# Patient Record
Sex: Female | Born: 1975
Health system: Southern US, Community
[De-identification: ages and names within clinical notes are randomized; demographics above are authoritative.]

## PROBLEM LIST (undated history)

## (undated) DIAGNOSIS — F411 Generalized anxiety disorder: Secondary | ICD-10-CM

## (undated) DIAGNOSIS — Z87891 Personal history of nicotine dependence: Secondary | ICD-10-CM

## (undated) DIAGNOSIS — F341 Dysthymic disorder: Secondary | ICD-10-CM

## (undated) DIAGNOSIS — R87619 Unspecified abnormal cytological findings in specimens from cervix uteri: Secondary | ICD-10-CM

## (undated) HISTORY — DX: Personal history of nicotine dependence: Z87.891

## (undated) HISTORY — DX: Unspecified abnormal cytological findings in specimens from cervix uteri: R87.619

## (undated) HISTORY — PX: TUBAL LIGATION: SHX77

## (undated) HISTORY — DX: Generalized anxiety disorder: F41.1

## (undated) HISTORY — DX: Dysthymic disorder: F34.1

---

## 1998-03-06 ENCOUNTER — Encounter: Admission: RE | Admit: 1998-03-06 | Discharge: 1998-03-14 | Payer: Self-pay | Admitting: Internal Medicine

## 1998-07-03 ENCOUNTER — Emergency Department (HOSPITAL_COMMUNITY): Admission: EM | Admit: 1998-07-03 | Discharge: 1998-07-03 | Payer: Self-pay | Admitting: Emergency Medicine

## 2000-11-28 ENCOUNTER — Other Ambulatory Visit: Admission: RE | Admit: 2000-11-28 | Discharge: 2000-11-28 | Payer: Self-pay | Admitting: *Deleted

## 2001-04-28 ENCOUNTER — Ambulatory Visit (HOSPITAL_COMMUNITY): Admission: RE | Admit: 2001-04-28 | Discharge: 2001-04-28 | Payer: Self-pay | Admitting: Obstetrics and Gynecology

## 2001-04-28 ENCOUNTER — Encounter: Payer: Self-pay | Admitting: Obstetrics and Gynecology

## 2001-09-06 ENCOUNTER — Encounter (HOSPITAL_COMMUNITY): Admission: AD | Admit: 2001-09-06 | Discharge: 2001-09-19 | Payer: Self-pay | Admitting: Obstetrics and Gynecology

## 2001-09-18 ENCOUNTER — Encounter (INDEPENDENT_AMBULATORY_CARE_PROVIDER_SITE_OTHER): Payer: Self-pay

## 2001-09-18 ENCOUNTER — Inpatient Hospital Stay (HOSPITAL_COMMUNITY): Admission: AD | Admit: 2001-09-18 | Discharge: 2001-09-20 | Payer: Self-pay | Admitting: Obstetrics and Gynecology

## 2003-03-01 ENCOUNTER — Ambulatory Visit (HOSPITAL_COMMUNITY): Admission: RE | Admit: 2003-03-01 | Discharge: 2003-03-01 | Payer: Self-pay | Admitting: Obstetrics and Gynecology

## 2003-03-01 ENCOUNTER — Encounter: Payer: Self-pay | Admitting: Obstetrics and Gynecology

## 2003-03-11 ENCOUNTER — Inpatient Hospital Stay (HOSPITAL_COMMUNITY): Admission: AD | Admit: 2003-03-11 | Discharge: 2003-03-11 | Payer: Self-pay | Admitting: Obstetrics and Gynecology

## 2003-03-19 ENCOUNTER — Inpatient Hospital Stay (HOSPITAL_COMMUNITY): Admission: AD | Admit: 2003-03-19 | Discharge: 2003-03-19 | Payer: Self-pay | Admitting: Obstetrics and Gynecology

## 2003-04-25 ENCOUNTER — Ambulatory Visit (HOSPITAL_COMMUNITY): Admission: RE | Admit: 2003-04-25 | Discharge: 2003-04-25 | Payer: Self-pay | Admitting: Obstetrics and Gynecology

## 2003-04-25 ENCOUNTER — Encounter: Payer: Self-pay | Admitting: Obstetrics and Gynecology

## 2003-09-26 ENCOUNTER — Inpatient Hospital Stay (HOSPITAL_COMMUNITY): Admission: AD | Admit: 2003-09-26 | Discharge: 2003-09-28 | Payer: Self-pay | Admitting: Obstetrics and Gynecology

## 2003-09-27 ENCOUNTER — Encounter (INDEPENDENT_AMBULATORY_CARE_PROVIDER_SITE_OTHER): Payer: Self-pay | Admitting: *Deleted

## 2006-11-22 ENCOUNTER — Other Ambulatory Visit: Admission: RE | Admit: 2006-11-22 | Discharge: 2006-11-22 | Payer: Self-pay | Admitting: Family Medicine

## 2006-11-22 ENCOUNTER — Ambulatory Visit: Payer: Self-pay | Admitting: Family Medicine

## 2006-11-22 LAB — HM PAP SMEAR: HM Pap smear: ABNORMAL

## 2006-11-29 ENCOUNTER — Encounter: Admission: RE | Admit: 2006-11-29 | Discharge: 2006-11-29 | Payer: Self-pay | Admitting: Family Medicine

## 2007-06-30 ENCOUNTER — Ambulatory Visit: Payer: Self-pay | Admitting: Family Medicine

## 2007-07-31 ENCOUNTER — Ambulatory Visit: Payer: Self-pay | Admitting: Family Medicine

## 2008-02-05 ENCOUNTER — Ambulatory Visit: Payer: Self-pay | Admitting: Family Medicine

## 2008-03-01 ENCOUNTER — Ambulatory Visit: Payer: Self-pay | Admitting: Family Medicine

## 2008-03-06 ENCOUNTER — Ambulatory Visit: Payer: Self-pay | Admitting: Family Medicine

## 2008-05-27 ENCOUNTER — Emergency Department (HOSPITAL_COMMUNITY): Admission: EM | Admit: 2008-05-27 | Discharge: 2008-05-27 | Payer: Self-pay | Admitting: Emergency Medicine

## 2008-06-10 ENCOUNTER — Ambulatory Visit: Payer: Self-pay | Admitting: Family Medicine

## 2008-09-26 ENCOUNTER — Ambulatory Visit: Payer: Self-pay | Admitting: Family Medicine

## 2009-09-24 ENCOUNTER — Ambulatory Visit: Payer: Self-pay | Admitting: Family Medicine

## 2010-07-10 ENCOUNTER — Emergency Department (HOSPITAL_COMMUNITY): Admission: EM | Admit: 2010-07-10 | Discharge: 2010-07-10 | Payer: Self-pay | Admitting: Emergency Medicine

## 2010-10-05 ENCOUNTER — Ambulatory Visit
Admission: RE | Admit: 2010-10-05 | Discharge: 2010-10-05 | Payer: Self-pay | Source: Home / Self Care | Attending: Family Medicine | Admitting: Family Medicine

## 2010-10-05 ENCOUNTER — Emergency Department (HOSPITAL_COMMUNITY)
Admission: EM | Admit: 2010-10-05 | Discharge: 2010-10-05 | Payer: Self-pay | Source: Home / Self Care | Admitting: Emergency Medicine

## 2010-10-07 LAB — BASIC METABOLIC PANEL
BUN: 7 mg/dL (ref 6–23)
CO2: 25 mEq/L (ref 19–32)
Calcium: 8.9 mg/dL (ref 8.4–10.5)
Chloride: 106 mEq/L (ref 96–112)
Creatinine, Ser: 0.64 mg/dL (ref 0.4–1.2)
GFR calc Af Amer: >60 mL/min (ref >60)
GFR calc non Af Amer: >60 mL/min (ref >60)
Glucose, Bld: 85 mg/dL (ref 70–99)
Potassium: 3.9 mEq/L (ref 3.5–5.1)
Sodium: 140 mEq/L (ref 135–145)

## 2010-10-07 LAB — DIFFERENTIAL
Basophils Absolute: 0 10*3/uL (ref 0.0–0.1)
Basophils Relative: 0 % (ref 0–1)
Eosinophils Absolute: 0.1 10*3/uL (ref 0.0–0.7)
Eosinophils Relative: 1 % (ref 0–5)
Lymphocytes Relative: 25 % (ref 12–46)
Lymphs Abs: 2 10*3/uL (ref 0.7–4.0)
Monocytes Absolute: 0.4 10*3/uL (ref 0.1–1.0)
Monocytes Relative: 5 % (ref 3–12)
Neutro Abs: 5.4 10*3/uL (ref 1.7–7.7)
Neutrophils Relative %: 68 % (ref 43–77)

## 2010-10-07 LAB — POCT CARDIAC MARKERS
CKMB, poc: <1 ng/mL — ABNORMAL LOW (ref 1.0–8.0)
Myoglobin, poc: 38.3 ng/mL (ref 12–200)
Troponin i, poc: <0.05 ng/mL (ref 0.00–0.09)

## 2010-10-07 LAB — CBC
HCT: 37.4 % (ref 36.0–46.0)
Hemoglobin: 11.6 g/dL — ABNORMAL LOW (ref 12.0–15.0)
MCH: 28 pg (ref 26.0–34.0)
MCHC: 31 g/dL (ref 30.0–36.0)
MCV: 90.3 fL (ref 78.0–100.0)
Platelets: 318 10*3/uL (ref 150–400)
RBC: 4.14 MIL/uL (ref 3.87–5.11)
RDW: 14.8 % (ref 11.5–15.5)
WBC: 7.9 10*3/uL (ref 4.0–10.5)

## 2010-12-02 LAB — URINALYSIS, ROUTINE W REFLEX MICROSCOPIC
Glucose, UA: NEGATIVE mg/dL
Ketones, ur: 15 mg/dL — AB
Leukocytes, UA: NEGATIVE
Nitrite: POSITIVE — AB
Protein, ur: NEGATIVE mg/dL
Specific Gravity, Urine: 1.034 — ABNORMAL HIGH (ref 1.005–1.030)
Urobilinogen, UA: 1 mg/dL (ref 0.0–1.0)
pH: 6 (ref 5.0–8.0)

## 2010-12-02 LAB — URINE MICROSCOPIC-ADD ON

## 2010-12-02 LAB — URINE CULTURE
Colony Count: >=100000
Culture  Setup Time: 201110212207

## 2010-12-02 LAB — POCT PREGNANCY, URINE: Preg Test, Ur: NEGATIVE

## 2011-02-05 NOTE — Op Note (Signed)
NAME:  Robin Escobar, Robin Escobar                     ACCOUNT NO.:  [id number]   MEDICAL RECORD NO.:  [id number]                   PATIENT TYPE:  INP   LOCATION:  9145                                 FACILITY:  [facility]   PHYSICIAN:  Huel Cote, M.D.              DATE OF BIRTH:  Oct 10, 1975   DATE OF PROCEDURE:  09/27/2003  DATE OF DISCHARGE:                                 OPERATIVE REPORT   PREOPERATIVE DIAGNOSES:  1. Status post normal spontaneous vaginal delivery.  2. Desires sterility.   POSTOPERATIVE DIAGNOSES:  1. Status post normal spontaneous vaginal delivery.  2. Desires sterility.   PROCEDURE:  Postpartum bilateral tubal ligation.   SURGEON:  Huel Cote, M.D.   ANESTHESIA:  Epidural.   FINDINGS:  Normal uterus, tubes and ovaries.   DESCRIPTION OF PROCEDURE:  The patient was taken to the operating room where  epidural anesthesia was found to be adequate by Allis clamp test.  She was  then prepped and draped in the normal sterile fashion in the dorsal supine  position.  A small 2 cm infraumbilical incision was then made with the  scalpel and carried through to the underlying layer of fascia by sharp  dissection.  The fascia was then entered sharply and the peritoneal cavity  entered bluntly.  The [employer] retractors were then placed within the  incision and the left fallopian tube identified, grasped with Babcock clamp  and traced out to the fimbriated end and then tented up in a 2 to 3 cm  knuckle of tube which was tied off with two free ties of 0 plain suture and  amputated with Mayo scissors.  The free pedicle was inspected and  additionally Bovie cauterized at each ostia and the tube itself was  hemostatic and returned to the abdomen.  In a similar fashion, the right  fallopian tube was grasped with the Babcock clamp, traced out to its  fimbriated end and tented up in a 2 to 3 cm knuckle of tube.  This was also  tied off with two free ties of 0 plain and the tube  amputated and handed off  to pathology.  The remaining pedicle was cauterized with Bovie cautery,  found to be hemostatic, and returned to the abdomen.  All instruments and  sponges were then removed from the patient's abdomen and the fascia was  closed with 0 Vicryl in a running fashion.  The skin was closed with 3-0  Vicryl in subcuticular stitch.                                               Huel Cote, M.D.    KR/MEDQ  D:  09/27/2003  T:  09/27/2003  Job:  [id number]

## 2011-02-05 NOTE — Discharge Summary (Signed)
[facility]  Patient:    Robin Escobar, Robin Escobar Visit Number: [id number] MRN: [id number]          Service Type: OBS Location: [facility] Attending Physician:  Parke Poisson Dictated by:   Zenaida Niece, M.D. Adm. Date:  09/18/01 Disc. Date: 09/20/01                             Discharge Summary  ADMISSION DIAGNOSES:          1. Intrauterine pregnancy at 31+ weeks.                               2. Spontaneous rupture of membranes.                               3. History of syphilis.  DISCHARGE DIAGNOSES:          1. Intrauterine pregnancy at 31+ weeks.                               2. Spontaneous rupture of membranes                               3. History of syphilis.                               4. Postpartum hemorrhage.  PROCEDURES:                   Spontaneous vaginal delivery.  COMPLICATIONS:                None.  CONSULTATIONS:                None.  HISTORY AND PHYSICAL:         This is a 35 year old white female, gravida 5, para 0-0-2-0 with an EGA of 38+ weeks with a due date of September 27, 2001 by an 11-week ultrasound.  She presents after a spontaneous rupture of membranes at approximately 12 noon on September 18, 2001.  She has had no significant contractions, and good fetal movement.  Pregnancy complicated by history of syphilis, treated with Bicillin x3 with decreased titers on February 19, 2001; otherwise uncomplicated.  PRENATAL LABS:                Blood type 0 positive.  Negative antibody screen.  RPR reactive.  Rubella immune.  Hepatitis B surface antigen negative HIV negative.  Gonorrhea and chlamydia negative.  Group B strep negative.  PAST OBSTETRICAL HISTORY:     One elective and one spontaneous abortion.  GYNECOLOGIC HISTORY:          History of syphilis.  SURGICAL HISTORY:             Chin surgery in 1999.  SOCIAL HISTORY:               She smokes a pack of cigarettes a day.  PHYSICAL EXAMINATION:  VITAL SIGNS:                   She is afebrile with stable vital signs, with a reactive fetal heart rate.  ABDOMEN:  Gravid, nontender; with an estimated fetal weight of 6 pounds.  CERVIX:                       One and 15 -12; she is grossly ruptured.  HOSPITAL COURSE:              The patient was admitted and started on Pitocin augmentation.  Throughout the day on September 18, 2001 she gradually progressed to complete; and early on the morning of September 19, 2001 she pushed well and had a vaginal delivery of a vigorous female infant, with Apgars of 9 and 9, weighed 5 pounds 15 ounces.  Nuchal cord x2.  Placenta was a difficult extraction and appeared to be adherent to the anterior lower uterine segment.  It was removed and inspected without large pieces missing; appeared intact.  Manual exploration revealed only small fragments, with no significant retained placenta.  She then had a postpartum hemorrhage, which responded to Mepergan, Pitocin and massage and Hemabate.   Estimated blood loss was 700 cc.  She had a first-degree perineal laceration, repaired with 2-0 Vicryl for hemostasis.  Postpartum she did very well.  Remained afebrile and was rapidly ambulating and tolerated a regular diet.  Pre-delivery hemoglobin 12.4, post-delivery 9.0 and fell to 7.9.  On the morning of postpartum day #1 the patient was hemodynamically stable and requested discharge home.  As her bleeding had slowed significantly, she was felt to be stable enough for discharge.  CONDITION ON DISCHARGE:       Stable.  DISPOSITION:                  Discharged to home.  DISCHARGE INSTRUCTIONS:       Diet is regular.  Activities ___________ in four to six weeks.  DISCHARGE MEDICATIONS:        Over-the-counter Motrin p.r.n., over-the-counter iron b.i.d.  She is to call for increased bleeding.  She is given our discharge pamphlet. Dictated by:   Zenaida Niece, M.D. Attending Physician:  Parke Poisson DD:   10/12/01 TD:  10/14/01 Job: [id number]

## 2011-02-05 NOTE — Discharge Summary (Signed)
NAME:  Robin Escobar, Robin Escobar                     ACCOUNT NO.:  [id number]   MEDICAL RECORD NO.:  [id number]                   PATIENT TYPE:  INP   LOCATION:  9145                                 FACILITY:  [facility]   PHYSICIAN:  Malachi Pro. Ambrose Mantle, M.D.              DATE OF BIRTH:  04/07/1976   DATE OF ADMISSION:  09/26/2003  DATE OF DISCHARGE:                                 DISCHARGE SUMMARY   HOSPITAL COURSE:  A 35 year old white female para 1-0-2-1 gravida 4 at [redacted]  weeks gestation with [facility] September 26, 2003 by a ten-week ultrasound presented  for induction of labor given favorable cervix.  Her prenatal care was  complicated by history of syphilis with an increased titer of RPR since her  last pregnancy so she was retreated with Bicillin x3.  Also, she was a  smoker.  She desired a tubal ligation.  Blood group and type O positive with  a negative antibody, rubella immune, hepatitis B surface antigen negative,  RPR 1:8 - received Bicillin x3, HIV negative, GC and chlamydia negative,  triple screen normal, group B strep negative, one-hour Glucola 83.  Past  obstetric history:  In 1992 and 1998 early abortion and spontaneous abortion  respectively.  In 2002 a spontaneous vaginal delivery of a 5-pound 15-ounce  infant.  Past GYN history:  History of syphilis as stated.  Surgical  history:  In 1999 had facial surgery.  Medical history:  Negative.  No known  allergies.  No medications.  She was afebrile with normal vital signs on  admission.  Heart and lungs were normal, abdomen was gravid.  Cervix was 2-3  cm, 50%, vertex at a -2, and artificial rupture of the membranes produced  clear fluid.  She became uncomfortable, received an epidural, and progressed  to 9 cm at 9 p.m.  She reached complete dilatation and pushed well with a  spontaneous vaginal delivery of a vigorous female infant, 6 pounds 14 ounces,  over an intact perineum with Apgars of 9 at one and 9 at five minutes.  Dr.  Senaida Ores was in  attendance.  Moderate shoulder dystocia was relieved with  McRoberts maneuver, wood screw, and suprapubic pressure.  Placenta was  delivered spontaneously, three-vessel cord was present, no laceration.  Cervix and rectum were intact, blood loss about 350 mL.  The patient desired  a postpartum tubal.  She was counseled regarding the risks and benefits  including bleeding, infection, and possible damage to bowel and bladder, and  risk of failure of approximately 1 in 150.  The patient understood and  agreed to proceed.  The procedure was done by Dr. Senaida Ores under epidural  anesthesia.  Findings were normal uterus, tubes, and ovaries.  Postpartum  and postoperative the patient did quite well and was discharged on  postpartum day #2.  Hemoglobin on admission 10.8; hematocrit 30.8; white  count 13,900; platelet count 323,000.  Follow-up hematocrit was 27.8.  RPR  was reactive.  The RPR titer was 1:4.  TPPA test was reactive.   FINAL DIAGNOSES:  1. Intrauterine pregnancy at 40 weeks delivered vertex.  2. Positive serology treated during pregnancy with three weekly injections     of Bicillin.   FINAL CONDITION:  Improved.   Instructions include our regular discharge instruction booklet.  The patient  is advised to avoid heavy lifting or strenuous activity, call with any fever  above 100.4 degrees, call with any unusual problems, return to the office in  2 weeks to see Dr. Senaida Ores, and given Percocet 5/325 #20 tablets one q.4-  6h. as needed for pain.                                               Malachi Pro. Ambrose Mantle, M.D.    TFH/MEDQ  D:  09/28/2003  T:  09/28/2003  Job:  [id number]

## 2011-04-16 ENCOUNTER — Encounter: Payer: Self-pay | Admitting: Family Medicine

## 2011-05-03 ENCOUNTER — Ambulatory Visit (INDEPENDENT_AMBULATORY_CARE_PROVIDER_SITE_OTHER): Payer: BC Managed Care – PPO | Admitting: Family Medicine

## 2011-05-03 ENCOUNTER — Encounter: Payer: Self-pay | Admitting: Family Medicine

## 2011-05-03 ENCOUNTER — Other Ambulatory Visit (HOSPITAL_COMMUNITY)
Admission: RE | Admit: 2011-05-03 | Discharge: 2011-05-03 | Disposition: A | Discharge status: Home / Self Care | Payer: BC Managed Care – PPO | Source: Ambulatory Visit | Attending: Family Medicine | Admitting: Family Medicine

## 2011-05-03 VITALS — BP 120/70 | HR 80 | Ht 63.0 in | Wt 128.0 lb

## 2011-05-03 DIAGNOSIS — Z124 Encounter for screening for malignant neoplasm of cervix: Secondary | ICD-10-CM | POA: Insufficient documentation

## 2011-05-03 DIAGNOSIS — F17200 Nicotine dependence, unspecified, uncomplicated: Secondary | ICD-10-CM | POA: Insufficient documentation

## 2011-05-03 DIAGNOSIS — R5383 Other fatigue: Secondary | ICD-10-CM

## 2011-05-03 DIAGNOSIS — E7800 Pure hypercholesterolemia, unspecified: Secondary | ICD-10-CM

## 2011-05-03 DIAGNOSIS — R300 Dysuria: Secondary | ICD-10-CM

## 2011-05-03 DIAGNOSIS — Z0000 Encounter for general adult medical examination without abnormal findings: Secondary | ICD-10-CM

## 2011-05-03 DIAGNOSIS — F411 Generalized anxiety disorder: Secondary | ICD-10-CM

## 2011-05-03 LAB — COMPREHENSIVE METABOLIC PANEL
ALT: <8 U/L (ref 0–35)
AST: 13 U/L (ref 0–37)
Albumin: 4.4 g/dL (ref 3.5–5.2)
Alkaline Phosphatase: 64 U/L (ref 39–117)
BUN: 11 mg/dL (ref 6–23)
CO2: 25 mEq/L (ref 19–32)
Calcium: 8.8 mg/dL (ref 8.4–10.5)
Chloride: 105 mEq/L (ref 96–112)
Creat: 0.58 mg/dL (ref 0.50–1.10)
Glucose, Bld: 82 mg/dL (ref 70–99)
Potassium: 4 mEq/L (ref 3.5–5.3)
Sodium: 137 mEq/L (ref 135–145)
Total Bilirubin: 0.8 mg/dL (ref 0.3–1.2)
Total Protein: 7.2 g/dL (ref 6.0–8.3)

## 2011-05-03 LAB — CBC WITH DIFFERENTIAL/PLATELET
Basophils Absolute: 0 10*3/uL (ref 0.0–0.1)
Basophils Relative: 0 % (ref 0–1)
Eosinophils Absolute: 0.2 10*3/uL (ref 0.0–0.7)
Eosinophils Relative: 1 % (ref 0–5)
HCT: 39.2 % (ref 36.0–46.0)
Hemoglobin: 12.5 g/dL (ref 12.0–15.0)
Lymphocytes Relative: 25 % (ref 12–46)
Lymphs Abs: 2.8 10*3/uL (ref 0.7–4.0)
MCH: 30.3 pg (ref 26.0–34.0)
MCHC: 31.9 g/dL (ref 30.0–36.0)
MCV: 95.1 fL (ref 78.0–100.0)
Monocytes Absolute: 0.6 10*3/uL (ref 0.1–1.0)
Monocytes Relative: 6 % (ref 3–12)
Neutro Abs: 7.5 10*3/uL (ref 1.7–7.7)
Neutrophils Relative %: 68 % (ref 43–77)
Platelets: 353 10*3/uL (ref 150–400)
RBC: 4.12 MIL/uL (ref 3.87–5.11)
RDW: 13.9 % (ref 11.5–15.5)
WBC: 11.1 10*3/uL — ABNORMAL HIGH (ref 4.0–10.5)

## 2011-05-03 LAB — POCT URINALYSIS DIPSTICK
Bilirubin, UA: NEGATIVE
Glucose, UA: NEGATIVE
Ketones, UA: NEGATIVE
Leukocytes, UA: NEGATIVE
Nitrite, UA: POSITIVE
Protein, UA: NEGATIVE
Spec Grav, UA: 1.01
Urobilinogen, UA: NEGATIVE
pH, UA: 7

## 2011-05-03 LAB — LIPID PANEL
Cholesterol: 194 mg/dL (ref 0–200)
HDL: 47 mg/dL (ref >39)
LDL Cholesterol: 131 mg/dL — ABNORMAL HIGH (ref 0–99)
Total CHOL/HDL Ratio: 4.1 Ratio
Triglycerides: 80 mg/dL (ref <150)
VLDL: 16 mg/dL (ref 0–40)

## 2011-05-03 LAB — TSH: TSH: 3.599 u[IU]/mL (ref 0.350–4.500)

## 2011-05-03 MED ORDER — BUPROPION HCL ER (SR) 150 MG PO TB12: 150.0000 mg | ORAL_TABLET | Freq: Two times a day (BID) | ORAL | Status: DC

## 2011-05-03 NOTE — Progress Notes (Signed)
Robin Escobar is a 35 y.o. female who presents for a complete physical.  She has the following concerns: She notices an odor to her urine over the last 2-3 weeks.  Denies dysuria, very rare urgency.  Had started taking vitamins, but stopped them and odor persists.  Denies eating asparagus.  Also has started getting up 2x/night to void.  Denies any change in her fluid intake, but drinking more water and less soda.  Noticed lump in R breast last week.  Actually noticed two different spots, one of which was tender.  She is due to start her menstrual cycle any time now, likely next week.  Immunization History  Administered Date(s) Administered  . Td 11/22/2002   Last Pap smear: 2008, showing LGSIL.  She reports she did NOT f/u with her GYN as was recommended.  Last pap prior was normal in 2004 Last mammogram: never Last colonoscopy: never Last DEXA: never Exercise:  Recently joined gym, going 3x/week Dentist: last year Ophtho: is supposed to wear glasses, but doesn't.  Thinks she went to eye doctor last year  Past Medical History  Diagnosis Date  . GAD (generalized anxiety disorder)   . Smoker   . Dysthymia   . Abnormal Pap smear of cervix     LGSIL 3/08    Past Surgical History  Procedure Date  . Tubal ligation     History   Social History  . Marital Status: Married    Spouse Name: N/A    Number of Children: 2  . Years of Education: N/A   Occupational History  . homemaker    Social History Main Topics  . Smoking status: Current Everyday Smoker -- 1.0 packs/day    Types: Cigarettes  . Smokeless tobacco: Never Used   Comment: has cut back; allergic to patch; didn't tolerate Chantix secondary to nausea  . Alcohol Use: Yes     occasionally.  . Drug Use: No  . Sexually Active: Not on file   Other Topics Concern  . Not on file   Social History Narrative   Has 2 children, and 2 stepchildren (stepdaughter is grown and lives elsewhere).  Lives with husband and 3 kids     Family History  Problem Relation Age of Onset  . Seizures Mother   . Stroke Mother   . Heart disease Father   . ADD / ADHD Daughter   . ADD / ADHD Son   . Diabetes Maternal Grandfather   . Stroke Maternal Grandfather   . Alzheimer's disease Paternal Grandmother    No current outpatient prescriptions on file prior to visit.   No Known Allergies  ROS: The patient denies anorexia, fever, weight changes, vision changes, decreased hearing, ear pain, sore throat, chest pain, palpitations, dizziness, syncope, dyspnea on exertion, cough, swelling, nausea, vomiting, diarrhea, constipation, abdominal pain, melena, hematochezia, indigestion/heartburn, hematuria, incontinence, irregular menstrual cycles, vaginal discharge, odor or itch, genital lesions, joint pains, weakness, tremor, suspicious skin lesions,  abnormal bleeding/bruising, or enlarged lymph nodes.  +fatigue, more than usual, + headaches, daily at her temples.  +dysuria.  Some tingling in arms, depending on how she sits, intermittent.  Has been off of wellbutrin for at least 6 months (was added to zoloft due to problems achieving orgasm, but ran out of zoloft even before she stopped taking the wellbutrin).  States that anxiety and moods have been okay, afraid to restart due to the sexual side effects, but later admits she thinks she does need it  PHYSICAL  EXAM: BP 120/70  Pulse 80  Ht 5\' 3"  (1.6 m)  Wt 128 lb (58.06 kg)  BMI 22.67 kg/m2  LMP 04/05/2011  General Appearance:    Alert, cooperative, no distress, appears stated age  Head:    Normocephalic, without obvious abnormality, atraumatic  Eyes:    PERRL, conjunctiva/corneas clear, EOM's intact, fundi    benign  Ears:    Normal TM's and external ear canals  Nose:   Nares normal, mucosa normal, no drainage or sinus   tenderness  Throat:   Lips, mucosa, and tongue normal; teeth and gums normal  Neck:   Supple, no lymphadenopathy;  thyroid:  no    enlargement/tenderness/nodules; no carotid   bruit or JVD  Back:    Spine nontender, no curvature, ROM normal, no CVA     tenderness  Lungs:     Clear to auscultation bilaterally without wheezes, rales or     ronchi; respirations unlabored  Chest Wall:    No tenderness or deformity   Heart:    Regular rate and rhythm, S1 and S2 normal, no murmur, rub   or gallop  Breast Exam:    No nipple discharge or inversion. No axillary lymphadenopathy.  Mild fibrocystic changes bilaterally.  R lateral breast has an area of prominent fibrocystic changes, one of which is larger in size than others, and more tender.  Similar fibrocystic changes (without the larger area) also noted LUOQ of breast  Abdomen:     Soft, non-tender, nondistended, normoactive bowel sounds,    no masses, no hepatosplenomegaly  Genitalia:    Normal external genitalia without lesions.  BUS and vagina normal; cervix without lesions, or cervical motion tenderness. No abnormal vaginal discharge, mild amount of thin white vaginal discharge present on cervix and in vaginal vault.Marland Kitchen  Uterus and adnexa not enlarged, nontender, no masses.  Pap performed  Rectal:    Not performed due to age<40 and no related complaints  Extremities:   No clubbing, cyanosis or edema  Pulses:   2+ and symmetric all extremities  Skin:   Skin color, texture, turgor normal, no rashes or lesions  Lymph nodes:   Cervical, supraclavicular, and axillary nodes normal  Neurologic:   CNII-XII intact, normal strength, sensation and gait; reflexes 2+ and symmetric throughout          Psych:   Normal mood, somewhat flat affect, normal hygiene and grooming, smells of cigarette smoke.    ASSESSMENT/PLAN: 1. Routine general medical examination at a health care facility  POCT urinalysis dipstick, Visual acuity screening, Cytology - PAP  2. Dysuria  Urine Culture  3. Anxiety state, unspecified  buPROPion (WELLBUTRIN SR) 150 MG 12 hr tablet  4. Fatigue  TSH, CBC with Differential,  Vitamin D 25 hydroxy, Comprehensive metabolic panel  5. Pure hypercholesterolemia  Lipid panel  6. Tobacco use disorder     Restart wellbutriin  SR 150 BID for anxiety and dysthymia; perhaps may also help with her attempts to quit smoking.  Risks of smoking reviewed at length.  Fibrocystic breasts, with one area of prominence at R lateral breast.  Recommend that she return for repeat breast exam in 1-2 weeks (after her cycle has ended) to ensure complete resolution.  If persistent asymmetry/mass, then will need referral for mammogram and ultrasound of the R breast.   Patient has h/o abnormal pap, with LGSIL noted in 2008.  Patient states she never followed up with GYN regarding this.  Await pap results.  Discussed monthly self  breast exams and yearly mammograms after the age of 39; at least 30 minutes of aerobic activity at least 5 days/week; proper sunscreen use reviewed; healthy diet, including goals of calcium and vitamin D intake and alcohol recommendations (less than or equal to 1 drink/day) reviewed; regular seatbelt use; changing batteries in smoke detectors.  Immunization recommendations discussed--flu shot in fall

## 2011-05-03 NOTE — Patient Instructions (Addendum)
Please schedule an appointment for re-check of your right breast after your next menstrual cycle as finished.  If there is residual lump, we will need further evaluation (with mammogram and ultrasound)  HEALTH MAINTENANCE RECOMMENDATIONS:  It is recommended that you get at least 30 minutes of aerobic exercise at least 5 days/week (for weight loss, you may need as much as 60-90 minutes). This can be any activity that gets your heart rate up. This can be divided in 10-15 minute intervals if needed, but try and build up your endurance at least once a week.  Weight bearing exercise is also recommended twice weekly.  Please quit smoking.  Try 1-800-quitnow or [place] quitline.com to help with counseling.  Eat a healthy diet with lots of vegetables, fruits and fiber.  "Colorful" foods have a lot of vitamins (ie green vegetables, tomatoes, red peppers, etc).  Limit sweet tea, regular sodas and alcoholic beverages, all of which has a lot of calories and sugar.  Up to 1 alcoholic drink daily may be beneficial for women (unless trying to lose weight, watch sugars).  Drink a lot of water.  Calcium recommendations are 1200-1500 mg daily (1500 mg for postmenopausal women or women without ovaries), and vitamin D 1000 IU daily.  This should be obtained from diet and/or supplements (vitamins), and calcium should not be taken all at once, but in divided doses.  Monthly self breast exams and yearly mammograms for women over the age of 85 is recommended.   Sunscreen of at least SPF 30 should be used on all sun-exposed parts of the skin when outside between the hours of 10 am and 4 pm (not just when at beach or pool, but even with exercise, golf, tennis, and yard work!)  Use a sunscreen that says "broad spectrum" so it covers both UVA and UVB rays, and make sure to reapply every 1-2 hours.  Remember to change the batteries in your smoke detectors when changing your clock times in the spring and fall.  Use your seat belt  every time you are in a car, and please drive safely and not be distracted with cell phones and texting while driving.

## 2011-05-04 LAB — VITAMIN D 25 HYDROXY (VIT D DEFICIENCY, FRACTURES): Vit D, 25-Hydroxy: 37 ng/mL (ref 30–89)

## 2011-05-05 ENCOUNTER — Telehealth: Payer: Self-pay | Admitting: *Deleted

## 2011-05-05 LAB — URINE CULTURE: Colony Count: >=100000

## 2011-05-05 NOTE — Telephone Encounter (Signed)
Left message for patient to return my call to go over labs. 

## 2011-05-06 ENCOUNTER — Other Ambulatory Visit: Payer: Self-pay | Admitting: *Deleted

## 2011-05-06 ENCOUNTER — Telehealth: Payer: Self-pay | Admitting: Family Medicine

## 2011-05-06 ENCOUNTER — Encounter: Payer: Self-pay | Admitting: Family Medicine

## 2011-05-06 DIAGNOSIS — N390 Urinary tract infection, site not specified: Secondary | ICD-10-CM

## 2011-05-06 MED ORDER — SULFAMETHOXAZOLE-TMP DS 800-160 MG PO TABS: 1.0000 | ORAL_TABLET | Freq: Two times a day (BID) | ORAL | Status: DC

## 2011-05-06 NOTE — Telephone Encounter (Signed)
Patient notifed of lab results, she will watch her diet and cholesterol intake by cutting back on suggested food/drink per Dr.Knapp. She was also notified of her UTI and Septra DS # 10 BID x 5 days was called into [facility] [address].

## 2011-05-12 ENCOUNTER — Encounter: Payer: Self-pay | Admitting: Family Medicine

## 2011-05-12 ENCOUNTER — Ambulatory Visit (INDEPENDENT_AMBULATORY_CARE_PROVIDER_SITE_OTHER): Payer: BC Managed Care – PPO | Admitting: Family Medicine

## 2011-05-12 VITALS — BP 100/60 | HR 80 | Ht 63.0 in | Wt 125.0 lb

## 2011-05-12 DIAGNOSIS — N63 Unspecified lump in unspecified breast: Secondary | ICD-10-CM

## 2011-05-12 NOTE — Progress Notes (Signed)
Patient presents for re-check of R breast . She had some more prominent fibrocystic changes noted laterally at R breast at her CPE last week.  She ended up starting her menstrual cycle the day we saw her (8/13).  Cycle has ended, but she reports no change in the lump in R breast.  Presents for re-evaluation  ROS: no nipple discharge, fever, pain, or other concerns  PHYSICAL EXAM: BP 100/60  Pulse 80  Ht 5\' 3"  (1.6 m)  Wt 125 lb (56.7 kg)  BMI 22.14 kg/m2  LMP 05/03/2011 Breasts: Fibrocystic changes bilaterally, mild, but area at 9 o'clock laterally is firm, more prominent.  Mobile, nontender.  No nipple discharge, skin dimpling or axillary lymphadenopathy  ASSESSMENT/PLAN: 1. Breast lump in female  MM Digital Diagnostic Bilat, US Breast Right   [facility] to [facility] for [facility] and u/s

## 2011-05-20 ENCOUNTER — Ambulatory Visit
Admission: RE | Admit: 2011-05-20 | Discharge: 2011-05-20 | Disposition: A | Discharge status: Home / Self Care | Payer: BC Managed Care – PPO | Source: Ambulatory Visit | Attending: Family Medicine | Admitting: Family Medicine

## 2011-05-20 ENCOUNTER — Telehealth: Payer: Self-pay | Admitting: *Deleted

## 2011-05-20 DIAGNOSIS — N63 Unspecified lump in unspecified breast: Secondary | ICD-10-CM

## 2011-05-20 NOTE — Telephone Encounter (Signed)
When I looked back in Robin Escobar's chart, I see the order for the ENT referral.  Please look into whether or not this was done.  Thanks--referral should be in the system

## 2011-05-20 NOTE — Telephone Encounter (Signed)
Called patient to give inform her that breast u/s and mammo were negative. She stated that her daughter, Sherial Ebrahim was supposed to have a referral to ENT to have the place in her ear patched where her tube came out. I did not know about this referral, would like me to set up?

## 2011-08-17 ENCOUNTER — Other Ambulatory Visit (INDEPENDENT_AMBULATORY_CARE_PROVIDER_SITE_OTHER): Payer: BC Managed Care – PPO

## 2011-08-17 DIAGNOSIS — Z23 Encounter for immunization: Secondary | ICD-10-CM

## 2014-10-04 ENCOUNTER — Ambulatory Visit (INDEPENDENT_AMBULATORY_CARE_PROVIDER_SITE_OTHER): Payer: BLUE CROSS/BLUE SHIELD | Admitting: Medical

## 2014-10-04 ENCOUNTER — Encounter: Payer: Self-pay | Admitting: Medical

## 2014-10-04 VITALS — BP 100/80 | HR 104 | Temp 98.2°F | Resp 15 | Wt 155.0 lb

## 2014-10-04 DIAGNOSIS — R059 Cough, unspecified: Secondary | ICD-10-CM

## 2014-10-04 DIAGNOSIS — R112 Nausea with vomiting, unspecified: Secondary | ICD-10-CM

## 2014-10-04 DIAGNOSIS — R05 Cough: Secondary | ICD-10-CM

## 2014-10-04 DIAGNOSIS — J069 Acute upper respiratory infection, unspecified: Secondary | ICD-10-CM

## 2014-10-04 MED ORDER — AMOXICILLIN 875 MG PO TABS: 875.0000 mg | ORAL_TABLET | Freq: Two times a day (BID) | ORAL | Status: DC

## 2014-10-04 NOTE — Progress Notes (Signed)
Subjective:  Robin Escobar is a 39 y.o. female who presents for possible sinus infection.  Symptoms include 5 day hx/o illness. Started with sore throat, then throat pain worsened followed by cough, sneezing, head congestion feels like head in a vice, hoarseness, sinus pain, ear pain, post nasal drainage.  Some nausea and vomiting with all the coughing.  Denies fever, SOB, wheezing. No belly pain.   Patient is a non-smoker, former smoker, quit last October.  Using mucinex sinus for symptoms.  Denies sick contacts.  No other aggravating or relieving factors.  No other c/o.  ROS as in subjective   Objective: Filed Vitals:   10/04/14 0810  BP: 100/80  Pulse: 104  Temp: 98.2 F (36.8 C)  Resp: 15    General appearance: Alert, WD/WN, no distress                             Skin: warm, no rash                           Head: no sinus tenderness,                            Eyes: conjunctiva normal, corneas clear, PERRLA                            Ears: pearly TMs, external ear canals normal                          Nose: septum midline, turbinates swollen, with erythema and clear discharge             Mouth/throat: MMM, tongue normal, mild pharyngeal erythema                           Neck: supple, no adenopathy, no thyromegaly, nontender                          Heart: RRR, normal S1, S2, no murmurs                         Lungs: CTA bilaterally, no wheezes, rales, or rhonchi      Assessment and Plan:  Encounter Diagnoses  Name Primary?  . Acute upper respiratory infection Yes  . Cough   . Nausea and vomiting, vomiting of unspecified type    Findings currently suggest viral URI.   C/t Mucinex sinus, rest, Tylenol or Ibuprofen OTC for fever and malaise.  Discussed symptomatic relief, nasal saline, rest, hydration.  Given the weekend, advised if much worse in the next few days and not seeing improvmeent as would be expected, then begin Amoxicillin.  otherwise this seems viral and  should resolve with more time.

## 2016-05-06 ENCOUNTER — Ambulatory Visit (INDEPENDENT_AMBULATORY_CARE_PROVIDER_SITE_OTHER): Payer: BLUE CROSS/BLUE SHIELD | Admitting: Medical

## 2016-05-06 ENCOUNTER — Encounter: Payer: Self-pay | Admitting: Medical

## 2016-05-06 VITALS — BP 100/70 | HR 84 | Wt 158.0 lb

## 2016-05-06 DIAGNOSIS — R079 Chest pain, unspecified: Secondary | ICD-10-CM | POA: Diagnosis not present

## 2016-05-06 DIAGNOSIS — M542 Cervicalgia: Secondary | ICD-10-CM | POA: Diagnosis not present

## 2016-05-06 DIAGNOSIS — Z87891 Personal history of nicotine dependence: Secondary | ICD-10-CM

## 2016-05-06 DIAGNOSIS — R202 Paresthesia of skin: Secondary | ICD-10-CM | POA: Diagnosis not present

## 2016-05-06 DIAGNOSIS — M792 Neuralgia and neuritis, unspecified: Secondary | ICD-10-CM | POA: Insufficient documentation

## 2016-05-06 MED ORDER — PREDNISONE 10 MG PO TABS: ORAL_TABLET | ORAL | 0 refills | Status: DC

## 2016-05-06 MED ORDER — HYDROCODONE-ACETAMINOPHEN 5-325 MG PO TABS: 1.0000 | ORAL_TABLET | Freq: Four times a day (QID) | ORAL | 0 refills | Status: DC | PRN

## 2016-05-06 NOTE — Progress Notes (Signed)
Subjective: Chief Complaint  Patient presents with  . Back Pain    and in her chest. said that her arm is "tingling" all the way down to her fingertips. said her neck is stiff and her upper back is painful. chest pain comes and goes pretty fast and is not constant and not that painful compared to the constant pains she experiencing in her neck and back   Here today for pains in right chest, right shoulder, neck, right arm.  Started 1+ week ago gradual onset that has worsened.   Neck hurts, pain with ROM, pain radiates down entire right arm into fingers, mild to moderate pain, has tingling in right arm x a week.  Denies injury, trauma, fall, no recent strenuous activity.   She does have chest pain generalized but more so on the right.  No associated SOB, sweats, palpitations, chest pains are brief, but can be central and substernal.  In general she does not exercise.  She is a former smoker with heart disease in her family.   She has at least 15 pack year history.  For these pains, using tylenol, some ibuprofen, heat, pain patch OTC.  No other aggravating or relieving factors. No other complaint.  Past Medical History:  Diagnosis Date  . Abnormal Pap smear of cervix    LGSIL 3/08  . Dysthymia   . GAD (generalized anxiety disorder)   . Smoker    Family History  Problem Relation Age of Onset  . Seizures Mother   . Stroke Mother   . Heart disease Father   . ADD / ADHD Daughter   . ADD / ADHD Son   . Diabetes Maternal Grandfather   . Stroke Maternal Grandfather   . Alzheimer's disease Paternal Grandmother    ROS as in subjective    Objective: BP 100/70   Pulse 84   Wt 158 lb (71.7 kg)   LMP 04/12/2016   BMI 27.99 kg/m   General appearance: alert, no distress, WD/WN Neck: supple, tender posterior neck over C7 prominence, tender right posterolateral neck, decreased neck extension due to pain, and pain with neck rotation L or R, with only mild decrease in ROM, no lymphadenopathy, no  thyromegaly, no masses Chest nontender, normal I:E Heart: RRR, normal S1, S2, no murmurs Lungs: CTA bilaterally, no wheezes, rhonchi, or rales Back: tender right upper back paraspinal region, otherwise nontender Tender over right shoulder generalized, but normal shoulder ROM, rest of arm nontender without deformity Pulses: 2+ symmetric, upper and lower extremities, normal cap refill DTRs dull of right arm compared to normal in left arm, but rest of sensation and strength normal of bilat UE    Adult ECG Report  Indication: chest pain  Rate: 70 bpm  Rhythm: normal sinus rhythm  QRS Axis: 51 degrees  PR Interval: 136ms  QRS Duration: 78ms  QTc: 425ms  Conduction Disturbances: none  Other Abnormalities: none  Patient's cardiac risk factors are: family history of heart disease, former smoker, not active/exercising  EKG comparison: none  Narrative Interpretation: normal EKG     Assessment: Encounter Diagnoses  Name Primary?  . Cervicalgia Yes  . Radicular pain of right upper extremity   . Arm paresthesia, right   . Chest pain, unspecified chest pain type   . Former smoker     Plan: Symptoms suggestive of cervical radiculopathy.   Begin medications below, use relative rest, stretching, avoid strenuous actively or injury, and if not significantly improved or resolved within 7-10 days recheck.  Reviewed 09/2010 C spine xray showing some concern for bulging disc with similar left sided radicular pain back then.     Reviewed EKG, advised she return soon for fasting labs, physical with Dr. Lynelle DoctorKnapp.   Robin Escobar was seen today for back pain.  Diagnoses and all orders for this visit:  Cervicalgia  Radicular pain of right upper extremity  Arm paresthesia, right  Chest pain, unspecified chest pain type  Former smoker  Other orders -     HYDROcodone-acetaminophen (NORCO/VICODIN) 5-325 MG tablet; Take 1 tablet by mouth every 6 (six) hours as needed for moderate pain. -      predniSONE (DELTASONE) 10 MG tablet; 6/5/4/3/2/1 taper

## 2016-05-07 NOTE — Addendum Note (Signed)
Addended by: Minette HeadlandBENFIELD, Lisseth Brazeau L on: 05/07/2016 08:05 AM   Modules accepted: Orders

## 2016-05-26 ENCOUNTER — Encounter: Payer: Self-pay | Admitting: Medical

## 2016-05-26 ENCOUNTER — Ambulatory Visit
Admission: RE | Admit: 2016-05-26 | Discharge: 2016-05-26 | Disposition: A | Discharge status: Home / Self Care | Payer: BLUE CROSS/BLUE SHIELD | Source: Ambulatory Visit | Attending: Medical | Admitting: Medical

## 2016-05-26 ENCOUNTER — Ambulatory Visit (INDEPENDENT_AMBULATORY_CARE_PROVIDER_SITE_OTHER): Payer: BLUE CROSS/BLUE SHIELD | Admitting: Medical

## 2016-05-26 VITALS — BP 110/70 | HR 80 | Resp 16 | Wt 161.6 lb

## 2016-05-26 DIAGNOSIS — M542 Cervicalgia: Secondary | ICD-10-CM

## 2016-05-26 DIAGNOSIS — M47812 Spondylosis without myelopathy or radiculopathy, cervical region: Secondary | ICD-10-CM | POA: Diagnosis not present

## 2016-05-26 DIAGNOSIS — M5412 Radiculopathy, cervical region: Secondary | ICD-10-CM

## 2016-05-26 MED ORDER — CYCLOBENZAPRINE HCL 10 MG PO TABS: ORAL_TABLET | ORAL | 0 refills | Status: DC

## 2016-05-26 NOTE — Progress Notes (Signed)
Subjective: Chief Complaint  Patient presents with  . Back Pain    Reports sx have not improved. pt is not fasting this am. Took medications as prescribed and no relief.    Here for f/u on back pain. When I saw her last on 05/06/16 she was having pains in neck, right arm throughout down to hand, radiating pain from neck to arm for 1+ weeks.  Pain is mild to moderate.   She had no injury or trauma.  No swelling.  She was having tingling into the arm.  Since last visit she didn't get the hydrocodone filled but used the steroid taper and had no relief.  Is actually worsening with pain compared to last visit.   Using ibuprofen currently with no relief.  No other aggravating or relieving factors. No other complaint.  Past Medical History:  Diagnosis Date  . Abnormal Pap smear of cervix    LGSIL 3/08  . Dysthymia   . GAD (generalized anxiety disorder)   . Smoker    Current Outpatient Prescriptions on File Prior to Visit  Medication Sig Dispense Refill  . predniSONE (DELTASONE) 10 MG tablet 6/5/4/3/2/1 taper 21 tablet 0  . HYDROcodone-acetaminophen (NORCO/VICODIN) 5-325 MG tablet Take 1 tablet by mouth every 6 (six) hours as needed for moderate pain. 15 tablet 0   No current facility-administered medications on file prior to visit.    ROS as in subjective    Objective: BP 110/70   Pulse 80   Resp 16   Wt 161 lb 9.6 oz (73.3 kg)   LMP 04/12/2016   BMI 28.63 kg/m   General appearance: alert, no distress, WD/WN Neck: supple, tender right posterolateral neck, decreased neck extension due to pain, and pain with neck rotation L or R, with only mild decrease in ROM, no lymphadenopathy, no thyromegaly, no masses. Neck extension only to about 10 degrees. Chest nontender, normal I:E Back nontender Arms nontender without deformity, normal ROM Pulses: 2+ symmetric, upper and lower extremities, normal cap refill DTRs dull of right arm compared to normal in left arm, but rest of sensation and  strength normal of bilat UE   Assessment: Encounter Diagnoses  Name Primary?  . Neck pain Yes  . Right cervical radiculopathy       Plan: Symptoms suggestive of cervical radiculopathy.   Will send for xray.  Advised she use the hydrocodone from last visit prn, begin flexeril prn, discussed risks/benefits of medication, proper use of medications.   Use relative rest, stretching, avoid strenuous actively or injury.   Reviewed 09/2010 C spine xray showing some concern for bulging disc with similar left sided radicular pain back then.      Robin Escobar was seen today for back pain.  Diagnoses and all orders for this visit:  Neck pain -     cyclobenzaprine (FLEXERIL) 10 MG tablet; 1/2-1 tablet QHS or up to BID prn -     DG Cervical Spine Complete; Future  Right cervical radiculopathy -     cyclobenzaprine (FLEXERIL) 10 MG tablet; 1/2-1 tablet QHS or up to BID prn -     DG Cervical Spine Complete; Future

## 2016-08-02 ENCOUNTER — Telehealth: Payer: Self-pay

## 2016-08-02 ENCOUNTER — Other Ambulatory Visit: Payer: Self-pay | Admitting: Medical

## 2016-08-02 DIAGNOSIS — M5412 Radiculopathy, cervical region: Secondary | ICD-10-CM

## 2016-08-02 DIAGNOSIS — M542 Cervicalgia: Secondary | ICD-10-CM

## 2016-08-02 MED ORDER — HYDROCODONE-ACETAMINOPHEN 5-325 MG PO TABS: 1.0000 | ORAL_TABLET | Freq: Four times a day (QID) | ORAL | 0 refills | Status: DC | PRN

## 2016-08-02 MED ORDER — CYCLOBENZAPRINE HCL 10 MG PO TABS: ORAL_TABLET | ORAL | 0 refills | Status: DC

## 2016-08-02 NOTE — Telephone Encounter (Signed)
Spoke with pt- informed her of med refills. Pt states she did NOT get referral in Sept as you discussed with her to try medication first. She wants referral to PT now.

## 2016-08-02 NOTE — Telephone Encounter (Signed)
Martie LeeSabrina please see below.

## 2016-08-02 NOTE — Telephone Encounter (Signed)
Sent the referral over to [facility]. They will contact patient

## 2016-08-02 NOTE — Telephone Encounter (Signed)
Has not been to PT- pt is asking if she can do this with pain medication.

## 2016-08-02 NOTE — Telephone Encounter (Signed)
Pt request referral to PT for neck pain. She questions if she needs refill of Flexeril and Hydrocodone or do PT?   Please call pt back at  [phone]

## 2016-08-02 NOTE — Telephone Encounter (Signed)
The pain medication is short term, night time only, not to be driving with this.   In the future if we discuss referral, and she doesn't hear back within a week, then call us.   I assumed she had been going to PT the last few weeks.   Please check with Martie LeeSabrina to see what happened, maybe she can help figure this out?  The referral was made last visit

## 2016-08-02 NOTE — Telephone Encounter (Signed)
Rx ready, but I referred for PT last visit.  Did she not get a call back from PT, has she not been going to PT?

## 2016-08-13 DIAGNOSIS — M542 Cervicalgia: Secondary | ICD-10-CM | POA: Diagnosis not present

## 2016-08-13 DIAGNOSIS — M6281 Muscle weakness (generalized): Secondary | ICD-10-CM | POA: Diagnosis not present

## 2016-08-13 DIAGNOSIS — M5013 Cervical disc disorder with radiculopathy, cervicothoracic region: Secondary | ICD-10-CM | POA: Diagnosis not present

## 2016-08-20 DIAGNOSIS — M5013 Cervical disc disorder with radiculopathy, cervicothoracic region: Secondary | ICD-10-CM | POA: Diagnosis not present

## 2016-08-20 DIAGNOSIS — M6281 Muscle weakness (generalized): Secondary | ICD-10-CM | POA: Diagnosis not present

## 2016-08-20 DIAGNOSIS — M542 Cervicalgia: Secondary | ICD-10-CM | POA: Diagnosis not present

## 2016-08-27 DIAGNOSIS — M542 Cervicalgia: Secondary | ICD-10-CM | POA: Diagnosis not present

## 2016-08-27 DIAGNOSIS — M6281 Muscle weakness (generalized): Secondary | ICD-10-CM | POA: Diagnosis not present

## 2016-08-27 DIAGNOSIS — M5013 Cervical disc disorder with radiculopathy, cervicothoracic region: Secondary | ICD-10-CM | POA: Diagnosis not present

## 2017-01-17 ENCOUNTER — Other Ambulatory Visit (INDEPENDENT_AMBULATORY_CARE_PROVIDER_SITE_OTHER): Payer: BLUE CROSS/BLUE SHIELD

## 2017-01-17 DIAGNOSIS — Z23 Encounter for immunization: Secondary | ICD-10-CM

## 2017-03-04 ENCOUNTER — Emergency Department (HOSPITAL_COMMUNITY)
Admission: EM | Admit: 2017-03-04 | Discharge: 2017-03-04 | Disposition: A | Discharge status: Home / Self Care | Payer: BLUE CROSS/BLUE SHIELD | Attending: Emergency Medicine | Admitting: Emergency Medicine

## 2017-03-04 ENCOUNTER — Encounter (HOSPITAL_COMMUNITY): Payer: Self-pay

## 2017-03-04 DIAGNOSIS — R21 Rash and other nonspecific skin eruption: Secondary | ICD-10-CM | POA: Diagnosis not present

## 2017-03-04 DIAGNOSIS — Z87891 Personal history of nicotine dependence: Secondary | ICD-10-CM | POA: Diagnosis not present

## 2017-03-04 DIAGNOSIS — W57XXXA Bitten or stung by nonvenomous insect and other nonvenomous arthropods, initial encounter: Secondary | ICD-10-CM | POA: Diagnosis not present

## 2017-03-04 DIAGNOSIS — S80861A Insect bite (nonvenomous), right lower leg, initial encounter: Secondary | ICD-10-CM | POA: Diagnosis not present

## 2017-03-04 MED ORDER — DOXYCYCLINE HYCLATE 100 MG PO CAPS: 100.0000 mg | ORAL_CAPSULE | Freq: Two times a day (BID) | ORAL | 0 refills | Status: DC

## 2017-03-04 NOTE — ED Triage Notes (Signed)
Pt complaining of tick bite to R leg. Pt states pulled tick off couple of days ago. Pt with two red spots on R lower leg. Pt denies any fevers or chills. Pt a/o x 4, ambulatory, NAD.

## 2017-03-04 NOTE — ED Provider Notes (Signed)
[facility]ENC[facility]PT Provider Note   CSN: [id number] Arrival date & time: 03/04/17  2021  By signing my name below, I, Robin Escobar, attest that this documentation has been prepared under the direction and in the presence of non-physician practitioner, Kerrie Buffalo, NP. Electronically Signed: Modena Escobar, Scribe. 03/04/2017. 11:13 PM.  History   Chief Complaint Chief Complaint  Patient presents with  . Tick Removal   The history is provided by the patient. No language interpreter was used.  Rash   This is a new problem. The current episode started more than 2 days ago. The problem has not changed since onset.The problem is associated with an insect bite/sting. There has been no fever. The rash is present on the right lower leg. She has tried nothing for the symptoms.   HPI Comments: Robin Escobar is a 41 y.o. female who presents to the Emergency Department complaining of constant moderate RLE rash that started 2 or 3 days ago. She states she pulled off a tick completely from her RLE. No treatment PTA. She describes the rash as two red/itchy spots. Denies any chance of pregnancy, fever, chills, headache, myalgias, or other complaints at this time.  Patient states that she is going to [place] with her family in 2 weeks and would like to get treated in case there is a change that the tick was carrying RMSF.  Past Medical History:  Diagnosis Date  . Abnormal Pap smear of cervix    LGSIL 3/08  . Dysthymia   . GAD (generalized anxiety disorder)   . Smoker     Patient Active Problem List   Diagnosis Date Noted  . Right cervical radiculopathy 05/26/2016  . Neck pain 05/06/2016  . Radicular pain of right upper extremity 05/06/2016  . Pain in the chest 05/06/2016  . Former smoker 05/06/2016  . Anxiety state, unspecified 05/03/2011  . Tobacco use disorder 05/03/2011    Past Surgical History:  Procedure Laterality Date  . TUBAL LIGATION      OB History    Gravida Para Term  Preterm AB Living   5 2     3 2    SAB TAB Ectopic Multiple Live Births   2 1             Home Medications    Prior to Admission medications   Medication Sig Start Date End Date Taking? Authorizing Provider  cyclobenzaprine (FLEXERIL) 10 MG tablet 1/2-1 tablet QHS or up to BID prn 08/02/16   Tysinger, Kermit Balo, PA-C  HYDROcodone-acetaminophen (NORCO/VICODIN) 5-325 MG tablet Take 1 tablet by mouth every 6 (six) hours as needed for moderate pain. 08/02/16   Tysinger, Kermit Balo, PA-C  predniSONE (DELTASONE) 10 MG tablet 6/5/4/3/2/1 taper 05/06/16   Tysinger, Kermit Balo, PA-C    Family History Family History  Problem Relation Age of Onset  . Seizures Mother   . Stroke Mother   . Heart disease Father 31       died of MI  . ADD / ADHD Daughter   . ADD / ADHD Son   . Diabetes Maternal Grandfather   . Stroke Maternal Grandfather   . Alzheimer's disease Paternal Grandmother     Social History Social History  Substance Use Topics  . Smoking status: Former Smoker    Packs/day: 1.00    Types: Cigarettes  . Smokeless tobacco: Never Used     Comment: has cut back; allergic to patch; didn't tolerate Chantix secondary to nausea  . Alcohol use 0.0  oz/week     Comment: occasionally.     Allergies   Patient has no known allergies.   Review of Systems Review of Systems  Constitutional: Negative for chills and fever.  HENT: Negative.   Gastrointestinal: Negative for nausea and vomiting.  Musculoskeletal: Negative for myalgias.  Skin: Positive for color change and rash.  Neurological: Negative for headaches.  Psychiatric/Behavioral: The patient is not nervous/anxious.      Physical Exam Updated Vital Signs BP 116/82 (BP Location: Right Arm)   Pulse 87   Temp 97.9 F (36.6 C)   Resp 18   SpO2 99%   Physical Exam  Constitutional: She appears well-developed and well-nourished. No distress.  HENT:  Head: Normocephalic.  Eyes: Conjunctivae are normal.  Neck: Neck supple.    Cardiovascular: Normal rate.   Pulmonary/Chest: Effort normal.  Abdominal: Soft.  Musculoskeletal: Normal range of motion.  See skin exam  Neurological: She is alert.  Skin: Skin is warm and dry.  Two small pustules to the anterior aspect of the right lower leg with 1 cm of surrounding erythema. No red streaking.  Psychiatric: She has a normal mood and affect.  Nursing note and vitals reviewed.    ED Treatments / Results  DIAGNOSTIC STUDIES: Oxygen Saturation is 99% on RA, normal by my interpretation.    COORDINATION OF CARE: 11:17 PM- Pt advised of plan for treatment and pt agrees.  Labs (all labs ordered are listed, but only abnormal results are displayed) Labs Reviewed - No data to display  Radiology No results found.  Procedures Procedures (including critical care time)  Medications Ordered in ED Medications - No data to display   Initial Impression / Assessment and Plan / ED Course  I have reviewed the triage vital signs and the nursing notes. Final Clinical Impressions(s) / ED Diagnoses  41 y.o. female with rash and itching to the lower right leg s/p tick removal stable for d/c without fever, headache or other signs of RMSF. Upon patient request due to going out of state Rx for Doxycycline.   Final diagnoses:  Tick bite, initial encounter    New Prescriptions New Prescriptions   No medications on file  I personally performed the services described in this documentation, which was scribed in my presence. The recorded information has been reviewed and is accurate.    Kerrie Buffaloeese, Brittany Osier [place], [place] 03/05/17 0215    Nira Connardama, Pedro Eduardo, MD 03/05/17 [phone]

## 2017-06-02 ENCOUNTER — Ambulatory Visit (INDEPENDENT_AMBULATORY_CARE_PROVIDER_SITE_OTHER): Payer: BLUE CROSS/BLUE SHIELD | Admitting: Medical

## 2017-06-02 ENCOUNTER — Encounter: Payer: Self-pay | Admitting: Medical

## 2017-06-02 VITALS — BP 110/68 | HR 77 | Wt 155.4 lb

## 2017-06-02 DIAGNOSIS — R51 Headache: Secondary | ICD-10-CM

## 2017-06-02 DIAGNOSIS — L989 Disorder of the skin and subcutaneous tissue, unspecified: Secondary | ICD-10-CM | POA: Diagnosis not present

## 2017-06-02 DIAGNOSIS — R519 Headache, unspecified: Secondary | ICD-10-CM

## 2017-06-02 NOTE — Progress Notes (Signed)
Subjective:    Robin Escobar is a 41 y.o. female who presents for  Chief Complaint  Patient presents with  . Nevus    mole on face x1 month change in size    She reports left cheek with growing skin lesion first noticed some time within past year.   Her son and husband think its getting bigger.   She also notes relatively new mole on left inner upper thigh.   No hx/o skin cancer.   She does have uncle with history of melanoma.  She also reports for the past several months getting a few headaches every week. The patient gets headaches frequently, several times per week, upon waking, sometimes. The headache is described as mild to moderate, dull and throbbing and is frontal in location.  The patient rates the pain a 4 on a scale from 1 to 10.   Precipitating factors include none which have been determined. The headache was not preceded by an aura. Associated neurologic symptoms which are present include none.  The patient denies dizziness, loss of balance, muscle weakness, numbness of extremities, speech difficulties, vomiting in the early morning and worsening school/work performance.  Patient denies abdominal pain, conjunctivitis, cough, dizziness, earache, fatigue, fever, irritability, nasal congestion, nausea, neck stiffness, photophobia, rash, rhinorrhea and sore throat.   Home treatment has included acetaminophen and ibuprofen with fair improvement.   Other history includes: nothing pertinent.    Family history includes no known family members with significant headaches.  The following portions of the patient's history were reviewed and updated as appropriate: allergies, current medications, past family history, past medical history, past social history, past surgical history and problem list.  Review of Systems Constitutional: -fever, -chills, -sweats, -unexpected weight change ENT: -runny nose, head congestion, sneezing, -ear pain or pressure, -sore throat, -hearing loss,  -recent allergy problems, -sinus problems Cardiology:  -chest pain, -palpitations, -edema, - hx/o pulsating artery in temple Respiratory: -cough, -shortness of breath, -wheezing Gastroenterology: -abdominal pain, -nausea, -vomiting, -diarrhea, -constipation Hematology: -bleeding or bruising problems Musculoskeletal: - no stiff neck, -arthralgias, -myalgias, -joint swelling, -back pain Ophthalmology: +vision changes, - vision loss, -double vision Urology: -dysuria, -difficulty urinating, -hematuria, -urinary frequency, -urgency Neurology: -weakness, -tingling, -numbness, -speech changes, - facial changes, -urinary or bowel incontinence, -LOC, -altered mental status.   Other: No recent head injury or concussion, no recent fasting from meals, no recent change in caffeine use, headaches are not worsened by exertion Psych: -no recent change in stress level      Objective:   Physical Exam  BP 110/68   Pulse 77   Wt 155 lb 6.4 oz (70.5 kg)   SpO2 98%   BMI 27.53 kg/m   General appearance: alert, no distress, WD/WN Skin: left lateral face/cheek with 4mm diameter well demarcated flesh colored raised papular lesion roundish, right nare with 3mm diameter somewhat pearly to flesh colored papule, unchanged per patient, distal dorsal nose with 2mm raised papular flesh colored lesion, unchanged per patient, left medial mid thigh with purplish lesion roughly 3mm diameter but somewhat irregular border, no other worrisome lesions HEENT: normocephalic, sclerae anicteric, PERRLA, EOMi, nares patent, no discharge or erythema, pharynx normal Oral cavity: MMM, no lesions Neck: supple, no lymphadenopathy, no thyromegaly, no masses, no bruits Heart: RRR, normal S1, S2, no murmurs Lungs: CTA bilaterally, no wheezes, rhonchi, or rales Musculoskeletal: nontender, no swelling, no obvious deformity Extremities: unremarkable Pulses: 2+ symmetric, upper and lower extremities Neurological: alert, oriented x 3, CN2-12  intact, strength normal upper  extremities and lower extremities, sensation normal throughout, DTRs 2+ throughout, no cerebellar signs, gait normal Psychiatric: normal affect, behavior normal, pleasant     Assessment:      Encounter Diagnoses  Name Primary?  . Chronic nonintractable headache, unspecified headache type Yes  . Skin lesions     Plan:   Skin lesions - refer to dermatology for further evaluation, possible biopsies, left face and left thigh  Headaches - etiology unclear.  Could be tension, medication induced from frequently analgesic use, as well as need for updated corrective lenses.  She will make appt with eye doctor.  She will keep a headache diary.  Discussed stopping OTC analgesics and letting those medications wash out.   If not improving in the next few weeks after seeing eye doctor, consider head imaging.     Recheck in 2-4 weeks with headache diary.  Discussed other worrisome headache symptoms that would prompt urgent recheck, particularly diplopia, fever, headaches awakening her from sleep or other worrisome neurologic symptoms.   Robin Escobar was seen today for nevus.  Diagnoses and all orders for this visit:  Chronic nonintractable headache, unspecified headache type  Skin lesions -     Ambulatory referral to Dermatology

## 2017-06-15 ENCOUNTER — Encounter (HOSPITAL_COMMUNITY): Payer: Self-pay | Admitting: Emergency Medicine

## 2017-06-15 ENCOUNTER — Ambulatory Visit (HOSPITAL_COMMUNITY)
Admission: EM | Admit: 2017-06-15 | Discharge: 2017-06-15 | Disposition: A | Discharge status: Home / Self Care | Payer: BLUE CROSS/BLUE SHIELD | Attending: Family Medicine | Admitting: Family Medicine

## 2017-06-15 DIAGNOSIS — K0889 Other specified disorders of teeth and supporting structures: Secondary | ICD-10-CM

## 2017-06-15 MED ORDER — HYDROCODONE-ACETAMINOPHEN 5-325 MG PO TABS: 1.0000 | ORAL_TABLET | Freq: Four times a day (QID) | ORAL | 0 refills | Status: DC | PRN

## 2017-06-15 MED ORDER — AMOXICILLIN-POT CLAVULANATE 875-125 MG PO TABS: 1.0000 | ORAL_TABLET | Freq: Two times a day (BID) | ORAL | 0 refills | Status: DC

## 2017-06-15 NOTE — ED Triage Notes (Signed)
PT reports right upper dental abscess that stared yesterday.

## 2017-06-16 NOTE — ED Provider Notes (Signed)
[facility]   [id number] 06/15/17 Arrival Time: 1652  ASSESSMENT & PLAN:  1. Pain, dental     Meds ordered this encounter  Medications  . HYDROcodone-acetaminophen (NORCO/VICODIN) 5-325 MG tablet    Sig: Take 1 tablet by mouth every 6 (six) hours as needed for moderate pain or severe pain.    Dispense:  10 tablet    Refill:  0  . amoxicillin-clavulanate (AUGMENTIN) 875-125 MG tablet    Sig: Take 1 tablet by mouth every 12 (twelve) hours.    Dispense:  14 tablet    Refill:  0   [place] Controlled Substances Registry consulted for this patient. I feel the risk/benefit ratio today is favorable for proceeding with this prescription for a controlled substance. Medication sedation precautions given.  Dental resource written instructions given. She will schedule dental evaluation as soon as possible.  Reviewed expectations re: course of current medical issues. Questions answered. Outlined signs and symptoms indicating need for more acute intervention. Patient verbalized understanding. After Visit Summary given.   SUBJECTIVE:  Robin Escobar is a 41 y.o. female who reports gradual onset of right upper dental pain. Present for 2 days. H/O similar. Does not see a dentist regularly. Afebrile. OTC analgesics without relief. No n/v. Normal PO intake but painful with chewing food. No neck pain or swelling. Does not regularly see a dentist.  ROS: As per HPI.  OBJECTIVE:  Vitals:   06/15/17 1738 06/15/17 1739  BP:  118/78  Pulse:  98  Resp:  16  Temp:  99.8 F (37.7 C)  TempSrc:  Oral  SpO2:  98%  Weight: 160 lb (72.6 kg)   Height:  (1.626 m)     General appearance: alert; no distress HENT: normocephalic; atraumatic; dentition: poor; gingival hypertrophy of R upper gum; no fluctuance appreciated but she is tender Neck: supple without LAD Lungs: normal respirations Skin: warm and dry Psychological: alert and cooperative; normal mood and affect  No Known  Allergies  Past Medical History:  Diagnosis Date  . Abnormal Pap smear of cervix    LGSIL 3/08  . Dysthymia   . GAD (generalized anxiety disorder)   . Smoker    Social History   Social History  . Marital status: Married    Spouse name: N/A  . Number of children: 2  . Years of education: N/A   Occupational History  . homemaker Unemployed   Social History Main Topics  . Smoking status: Former Smoker    Packs/day: 1.00    Types: Cigarettes  . Smokeless tobacco: Never Used     Comment: has cut back; allergic to patch; didn't tolerate Chantix secondary to nausea  . Alcohol use 0.0 oz/week     Comment: occasionally.  . Drug use: No  . Sexual activity: Not on file   Other Topics Concern  . Not on file   Social History Narrative   Has 2 children, and 2 stepchildren (stepdaughter is grown and lives elsewhere).  Lives with husband and 3 kids   Family History  Problem Relation Age of Onset  . Seizures Mother   . Stroke Mother   . Heart disease Father 22       died of MI  . ADD / ADHD Daughter   . ADD / ADHD Son   . Diabetes Maternal Grandfather   . Stroke Maternal Grandfather   . Alzheimer's disease Paternal Grandmother    Past Surgical History:  Procedure Laterality Date  . TUBAL LIGATION  Mardella Layman, MD 06/16/17 [phone]

## 2017-07-27 DIAGNOSIS — D485 Neoplasm of uncertain behavior of skin: Secondary | ICD-10-CM | POA: Diagnosis not present

## 2017-07-27 DIAGNOSIS — D2230 Melanocytic nevi of unspecified part of face: Secondary | ICD-10-CM | POA: Diagnosis not present

## 2017-07-27 DIAGNOSIS — Z23 Encounter for immunization: Secondary | ICD-10-CM | POA: Diagnosis not present

## 2017-07-27 DIAGNOSIS — L820 Inflamed seborrheic keratosis: Secondary | ICD-10-CM | POA: Diagnosis not present

## 2017-07-27 DIAGNOSIS — D1800 Hemangioma unspecified site: Secondary | ICD-10-CM | POA: Diagnosis not present

## 2017-11-10 ENCOUNTER — Other Ambulatory Visit: Payer: Self-pay | Admitting: Medical

## 2017-11-10 MED ORDER — OSELTAMIVIR PHOSPHATE 75 MG PO CAPS: 75.0000 mg | ORAL_CAPSULE | Freq: Every day | ORAL | 0 refills | Status: DC

## 2018-01-07 ENCOUNTER — Ambulatory Visit (HOSPITAL_COMMUNITY)
Admission: EM | Admit: 2018-01-07 | Discharge: 2018-01-07 | Disposition: A | Discharge status: Home / Self Care | Payer: BLUE CROSS/BLUE SHIELD | Attending: Family Medicine | Admitting: Family Medicine

## 2018-01-07 ENCOUNTER — Encounter (HOSPITAL_COMMUNITY): Payer: Self-pay | Admitting: Emergency Medicine

## 2018-01-07 DIAGNOSIS — K0889 Other specified disorders of teeth and supporting structures: Secondary | ICD-10-CM

## 2018-01-07 MED ORDER — NAPROXEN 500 MG PO TBEC: 500.0000 mg | DELAYED_RELEASE_TABLET | Freq: Two times a day (BID) | ORAL | 0 refills | Status: DC

## 2018-01-07 MED ORDER — AMOXICILLIN 875 MG PO TABS: 875.0000 mg | ORAL_TABLET | Freq: Two times a day (BID) | ORAL | 0 refills | Status: AC

## 2018-01-07 NOTE — ED Provider Notes (Signed)
  [facility]    CSN: [id number] Arrival date & time: 01/07/18  1217  Chief Complaint  Patient presents with  . Sore Throat  . Abscess    Robin Escobar here for URI complaints.  Duration: 7 days  Associated symptoms: intermittent cough, sore throat and dental pain; feels swelling along gums Denies: sinus congestion, sinus pain, rhinorrhea, ear pain, ear drainage, wheezing, shortness of breath and fevers Treatment to date: Hydrocodone Has dentist for hx of dental issues.   ROS:  Const: Denies fevers HEENT: As noted in HPI Lungs: No SOB  Past Medical History:  Diagnosis Date  . Abnormal Pap smear of cervix    LGSIL 3/08  . Dysthymia   . GAD (generalized anxiety disorder)   . Smoker    Family History  Problem Relation Age of Onset  . Seizures Mother   . Stroke Mother   . Heart disease Father [id number]       died of MI  . ADD / ADHD Daughter   . ADD / ADHD Son   . Diabetes Maternal Grandfather   . Stroke Maternal Grandfather   . Alzheimer's disease Paternal Grandmother     BP 119/71 (BP Location: Right Arm)   Pulse 80   Temp 99 F (37.2 C) (Oral)   Resp 16   SpO2 100%  General: Awake, alert, appears stated age HEENT: AT, [place], MMM, upper molar gingiva is edematous with concordant dental decay on the R. No erythema or drainage noted. Tonsillar pillars are erythematous.  Neck: No masses or asymmetry Heart: RRR Lungs: CTAB, no accessory muscle use Psych: Age appropriate judgment and insight, normal mood and affect  Dentalgia  Amoxicillin, Naproxen, follow up with dentist.  F/u with PCP if issue fails to improve, needs to call dentist asap though.  Pt voiced understanding and agreement to the plan.     Robin DoryWendling, Robin Pancoast Paul, DO 01/07/18 1402

## 2018-01-07 NOTE — ED Triage Notes (Signed)
Pt states she wears  Plate inside her mouth and states theres an abscess in there which  Is making her throat hurt.

## 2018-01-07 NOTE — Discharge Instructions (Signed)
Call your dentist ASAP!  Ice may be helpful to relieve pain. Salt water gargles may help your throat.  Fevers and increasing pain despite medicine should prompt seeking medical care.

## 2018-11-20 ENCOUNTER — Ambulatory Visit: Payer: BLUE CROSS/BLUE SHIELD | Admitting: Medical

## 2018-11-20 ENCOUNTER — Encounter: Payer: Self-pay | Admitting: Medical

## 2018-11-20 VITALS — BP 110/70 | HR 73 | Temp 98.3°F | Resp 16 | Wt 154.8 lb

## 2018-11-20 DIAGNOSIS — G8929 Other chronic pain: Secondary | ICD-10-CM

## 2018-11-20 DIAGNOSIS — R079 Chest pain, unspecified: Secondary | ICD-10-CM

## 2018-11-20 DIAGNOSIS — R42 Dizziness and giddiness: Secondary | ICD-10-CM | POA: Diagnosis not present

## 2018-11-20 DIAGNOSIS — R51 Headache: Secondary | ICD-10-CM

## 2018-11-20 DIAGNOSIS — M542 Cervicalgia: Secondary | ICD-10-CM | POA: Diagnosis not present

## 2018-11-20 DIAGNOSIS — Z136 Encounter for screening for cardiovascular disorders: Secondary | ICD-10-CM

## 2018-11-20 NOTE — Progress Notes (Signed)
Subjective: Chief Complaint  Patient presents with  . chest discomfort    chest discomfort wants labs X saturday   Here for some chest discomfort, some sharp chest pains over the weekend  Having some migraines, headaches, some neck pains, some dizziness  Quit smoking 6 years ago  Saw eye doctor recently, got new glasses.  Getting chest pains a lot this past Saturday 2 days ago, jolts of pain in center of chest.  Sunday morning had similar pain.  The pains would last minutes, but not hours.   Had some nausea with the pains.   Had some recent vomiting on Saturday for no reason.   No associated SOB, no paresthesias.  Denies GERD issues, does eat a variety of foods though.  Does worry some, but not sure if this is anxiety.  No hx/o snoring or witnessed apnea, but does endorse daytime somnolence at times, non restful sleep.  No recent URI symptoms  No problems with urination or bowels.   No blood in urine or stool.   Does have heavy periods  Is way past due on pap, mammogram.  No other aggravating or relieving factors. No other complaint.   Past Medical History:  Diagnosis Date  . Abnormal Pap smear of cervix    LGSIL 3/08  . Dysthymia   . GAD (generalized anxiety disorder)   . Smoker    Current Outpatient Medications on File Prior to Visit  Medication Sig Dispense Refill  . naproxen (EC NAPROSYN) 500 MG EC tablet Take 1 tablet (500 mg total) by mouth 2 (two) times daily with a meal. (Patient not taking: Reported on 11/20/2018) 30 tablet 0   No current facility-administered medications on file prior to visit.    ROS as in subjective   Objective: BP 110/70   Pulse 73   Temp 98.3 F (36.8 C) (Oral)   Resp 16   Wt 154 lb 12.8 oz (70.2 kg)   LMP 10/31/2018 (Exact Date)   SpO2 98%   BMI 26.57 kg/m   Wt Readings from Last 3 Encounters:  11/20/18 154 lb 12.8 oz (70.2 kg)  06/15/17 160 lb (72.6 kg)  06/02/17 155 lb 6.4 oz (70.5 kg)   BP Readings from Last 3  Encounters:  11/20/18 110/70  01/07/18 119/71  06/15/17 118/78      General appearance: alert, no distress, WD/WN,  HEENT: normocephalic, sclerae anicteric, PERRLA, EOMi, nares patent, no discharge or erythema, pharynx normal Oral cavity: MMM, no lesions Skin: somewhat pale Neck: supple, no lymphadenopathy, no thyromegaly, no masses Heart: RRR, normal S1, S2, no murmurs Lungs: CTA bilaterally, no wheezes, rhonchi, or rales Abdomen: +bs, soft, non tender, non distended, no masses, no hepatomegaly, no splenomegaly Back: non tender Musculoskeletal: nontender, no swelling, no obvious deformity Extremities: no edema, no cyanosis, no clubbing Pulses: 2+ symmetric, upper and lower extremities, normal cap refill Neurological: alert, oriented x 3, CN2-12 intact, strength normal upper extremities and lower extremities, sensation normal throughout, DTRs 2+ throughout, no cerebellar signs, gait normal Psychiatric: normal affect, behavior normal, pleasant    EKG Indication chest pain, rate 71 bpm, PR interval 134 ms, QRS 74 ms, QTC 423 ms, axis 38 degrees, normal sinus rhythm   Assessment: Encounter Diagnoses  Name Primary?  . Chest pain, unspecified type Yes  . Chronic nonintractable headache, unspecified headache type   . Neck pain   . Dizziness   . Screening for heart disease      Plan: EKG normal Labs today We discussed  her concerns and wide differential If labs normal, consider chest x-ray, consider baseline stress test, consider sleep study She just recently got new glasses She is way past due on cancer screening so advise she return and see PCP here soon for Pap, mammogram up-to-date, preventative care visit   Robin Escobar was seen today for chest discomfort.  Diagnoses and all orders for this visit:  Chest pain, unspecified type -     EKG 12-Lead -     Comprehensive metabolic panel -     CBC with Differential/Platelet -     TSH -     Lipid panel  Chronic nonintractable  headache, unspecified headache type -     EKG 12-Lead -     Comprehensive metabolic panel -     CBC with Differential/Platelet -     TSH -     Lipid panel  Neck pain -     EKG 12-Lead -     Comprehensive metabolic panel -     CBC with Differential/Platelet -     TSH -     Lipid panel  Dizziness -     EKG 12-Lead -     Comprehensive metabolic panel -     CBC with Differential/Platelet -     TSH -     Lipid panel  Screening for heart disease -     EKG 12-Lead -     Comprehensive metabolic panel -     CBC with Differential/Platelet -     TSH -     Lipid panel

## 2018-11-21 ENCOUNTER — Encounter: Payer: Self-pay | Admitting: Medical

## 2018-11-21 LAB — CBC WITH DIFFERENTIAL/PLATELET
BASOS ABS: 0.1 10*3/uL (ref 0.0–0.2)
Basos: 1 %
EOS (ABSOLUTE): 0.1 10*3/uL (ref 0.0–0.4)
Eos: 1 %
HEMOGLOBIN: 12.4 g/dL (ref 11.1–15.9)
Hematocrit: 37.7 % (ref 34.0–46.6)
IMMATURE GRANS (ABS): 0 10*3/uL (ref 0.0–0.1)
IMMATURE GRANULOCYTES: 0 %
LYMPHS: 30 %
Lymphocytes Absolute: 2.9 10*3/uL (ref 0.7–3.1)
MCH: 29.5 pg (ref 26.6–33.0)
MCHC: 32.9 g/dL (ref 31.5–35.7)
MCV: 90 fL (ref 79–97)
Monocytes Absolute: 0.6 10*3/uL (ref 0.1–0.9)
Monocytes: 6 %
NEUTROS ABS: 5.8 10*3/uL (ref 1.4–7.0)
NEUTROS PCT: 62 %
PLATELETS: 356 10*3/uL (ref 150–450)
RBC: 4.21 x10E6/uL (ref 3.77–5.28)
RDW: 12.8 % (ref 11.7–15.4)
WBC: 9.5 10*3/uL (ref 3.4–10.8)

## 2018-11-21 LAB — COMPREHENSIVE METABOLIC PANEL
A/G RATIO: 1.5 (ref 1.2–2.2)
ALT: 8 IU/L (ref 0–32)
AST: 16 IU/L (ref 0–40)
Albumin: 4.4 g/dL (ref 3.8–4.8)
Alkaline Phosphatase: 72 IU/L (ref 39–117)
BILIRUBIN TOTAL: 0.4 mg/dL (ref 0.0–1.2)
BUN/Creatinine Ratio: 15 (ref 9–23)
BUN: 11 mg/dL (ref 6–24)
CHLORIDE: 104 mmol/L (ref 96–106)
CO2: 20 mmol/L (ref 20–29)
Calcium: 8.8 mg/dL (ref 8.7–10.2)
Creatinine, Ser: 0.74 mg/dL (ref 0.57–1.00)
GFR calc Af Amer: 116 mL/min/{1.73_m2} (ref >59)
GFR calc non Af Amer: 100 mL/min/{1.73_m2} (ref >59)
Globulin, Total: 2.9 g/dL (ref 1.5–4.5)
Glucose: 87 mg/dL (ref 65–99)
POTASSIUM: 3.6 mmol/L (ref 3.5–5.2)
Sodium: 138 mmol/L (ref 134–144)
Total Protein: 7.3 g/dL (ref 6.0–8.5)

## 2018-11-21 LAB — LIPID PANEL
CHOLESTEROL TOTAL: 242 mg/dL — AB (ref 100–199)
Chol/HDL Ratio: 3.6 ratio (ref 0.0–4.4)
HDL: 68 mg/dL (ref >39)
LDL Calculated: 160 mg/dL — ABNORMAL HIGH (ref 0–99)
Triglycerides: 72 mg/dL (ref 0–149)
VLDL Cholesterol Cal: 14 mg/dL (ref 5–40)

## 2018-11-21 LAB — TSH: TSH: 1.99 u[IU]/mL (ref 0.450–4.500)

## 2019-12-26 ENCOUNTER — Ambulatory Visit: Payer: BLUE CROSS/BLUE SHIELD | Admitting: Medical

## 2020-04-25 ENCOUNTER — Ambulatory Visit (HOSPITAL_COMMUNITY)
Admission: EM | Admit: 2020-04-25 | Discharge: 2020-04-25 | Disposition: A | Discharge status: Home / Self Care | Payer: BC Managed Care – PPO | Attending: Family Medicine | Admitting: Family Medicine

## 2020-04-25 ENCOUNTER — Encounter (HOSPITAL_COMMUNITY): Payer: Self-pay

## 2020-04-25 ENCOUNTER — Other Ambulatory Visit: Payer: Self-pay

## 2020-04-25 DIAGNOSIS — J069 Acute upper respiratory infection, unspecified: Secondary | ICD-10-CM | POA: Diagnosis not present

## 2020-04-25 DIAGNOSIS — Z20822 Contact with and (suspected) exposure to covid-19: Secondary | ICD-10-CM | POA: Insufficient documentation

## 2020-04-25 NOTE — Discharge Instructions (Signed)
Covid test pending, monitor my chart for results Rest and fluids Tylenol and ibuprofen for fevers, body aches, headache Over-the-counter medicine for cough and congestion Follow-up if not improving or worsening

## 2020-04-25 NOTE — ED Triage Notes (Signed)
Pt with cold symptoms x 1 week, woke up today with headache and lack of taste or smell

## 2020-04-26 LAB — SARS CORONAVIRUS 2 (TAT 6-24 HRS): SARS Coronavirus 2: POSITIVE — AB

## 2020-04-26 NOTE — ED Provider Notes (Signed)
[facility]    CSN: [id number] Arrival date & time: 04/25/20  1828      History   Chief Complaint Chief Complaint  Patient presents with   COVID testing    HPI Robin Escobar is a 44 y.o. female presenting today for Covid testing. Patient has had cold symptoms over the past week, woke up today with headache and loss of taste and smell. Concerned about Covid. Denies any difficulty breathing or shortness of breath. Reports symptoms have been overall mild. Has had some mild nausea and decreased appetite.  HPI  Past Medical History:  Diagnosis Date   Abnormal Pap smear of cervix    LGSIL 3/08   Dysthymia    Former smoker    GAD (generalized anxiety disorder)     Patient Active Problem List   Diagnosis Date Noted   Right cervical radiculopathy 05/26/2016   Neck pain 05/06/2016   Radicular pain of right upper extremity 05/06/2016   Pain in the chest 05/06/2016   Former smoker 05/06/2016   Anxiety state, unspecified 05/03/2011   Tobacco use disorder 05/03/2011    Past Surgical History:  Procedure Laterality Date   TUBAL LIGATION      OB History    Gravida  5   Para  2   Term      Preterm      AB  3   Living  2     SAB  2   TAB  1   Ectopic      Multiple      Live Births               Home Medications    Prior to Admission medications   Not on File    Family History Family History  Problem Relation Age of Onset   Seizures Mother    Stroke Mother    Heart disease Father 6       died of MI   ADD / ADHD Daughter    ADD / ADHD Son    Diabetes Maternal Grandfather    Stroke Maternal Grandfather    Alzheimer's disease Paternal Grandmother     Social History Social History   Tobacco Use   Smoking status: Former Smoker    Packs/day: 1.00    Types: Cigarettes   Smokeless tobacco: Never Used   Tobacco comment: has cut back; allergic to patch; didn't tolerate Chantix secondary to nausea    Substance Use Topics   Alcohol use: Yes    Alcohol/week: 0.0 standard drinks    Comment: occasionally.   Drug use: No     Allergies   Patient has no known allergies.   Review of Systems Review of Systems  Constitutional: Positive for appetite change. Negative for activity change, chills, fatigue and fever.  HENT: Positive for congestion and rhinorrhea. Negative for ear pain, sinus pressure, sore throat and trouble swallowing.   Eyes: Negative for discharge and redness.  Respiratory: Positive for cough. Negative for chest tightness and shortness of breath.   Cardiovascular: Negative for chest pain.  Gastrointestinal: Positive for nausea. Negative for abdominal pain, diarrhea and vomiting.  Musculoskeletal: Negative for myalgias.  Skin: Negative for rash.  Neurological: Negative for dizziness, light-headedness and headaches.     Physical Exam Triage Vital Signs ED Triage Vitals [04/25/20 1901]  Enc Vitals Group     BP 128/83     Pulse Rate 77     Resp 16     Temp  98.4 F (36.9 C)     Temp src      SpO2 100 %     Weight      Height      Head Circumference      Peak Flow      Pain Score 0     Pain Loc      Pain Edu?      Excl. in GC?    No data found.  Updated Vital Signs BP 128/83    Pulse 77    Temp 98.4 F (36.9 C)    Resp 16    SpO2 100%   Visual Acuity Right Eye Distance:   Left Eye Distance:   Bilateral Distance:    Right Eye Near:   Left Eye Near:    Bilateral Near:     Physical Exam Vitals and nursing note reviewed.  Constitutional:      Appearance: She is well-developed.     Comments: No acute distress  HENT:     Head: Normocephalic and atraumatic.     Ears:     Comments: Bilateral ears without tenderness to palpation of external auricle, tragus and mastoid, EAC's without erythema or swelling, TM's with good bony landmarks and cone of light. Non erythematous.     Nose: Nose normal.     Mouth/Throat:     Comments: Oral mucosa pink and  moist, no tonsillar enlargement or exudate. Posterior pharynx patent and nonerythematous, no uvula deviation or swelling. Normal phonation. Eyes:     Conjunctiva/sclera: Conjunctivae normal.  Cardiovascular:     Rate and Rhythm: Normal rate.  Pulmonary:     Effort: Pulmonary effort is normal. No respiratory distress.     Comments: Breathing comfortably at rest, CTABL, no wheezing, rales or other adventitious sounds auscultated Abdominal:     General: There is no distension.  Musculoskeletal:        General: Normal range of motion.     Cervical back: Neck supple.  Skin:    General: Skin is warm and dry.  Neurological:     Mental Status: She is alert and oriented to person, place, and time.      UC Treatments / Results  Labs (all labs ordered are listed, but only abnormal results are displayed) Labs Reviewed  SARS CORONAVIRUS 2 (TAT 6-24 HRS) - Abnormal; Notable for the following components:      Result Value   SARS Coronavirus 2 POSITIVE (*)    All other components within normal limits    EKG   Radiology No results found.  Procedures Procedures (including critical care time)  Medications Ordered in UC Medications - No data to display  Initial Impression / Assessment and Plan / UC Course  I have reviewed the triage vital signs and the nursing notes.  Pertinent labs & imaging results that were available during my care of the patient were reviewed by me and considered in my medical decision making (see chart for details).     Covid test pending, suspect likely viral etiology and recommending symptomatic and supportive care, rest and fluids.  Discussed strict return precautions. Patient verbalized understanding and is agreeable with plan.  Final Clinical Impressions(s) / UC Diagnoses   Final diagnoses:  Viral URI with cough  Encounter for laboratory testing for COVID-19 virus     Discharge Instructions     Covid test pending, monitor my chart for  results Rest and fluids Tylenol and ibuprofen for fevers, body aches, headache Over-the-counter medicine for  cough and congestion Follow-up if not improving or worsening   ED Prescriptions    None     PDMP not reviewed this encounter.   Lew Dawes, [place] 04/26/20 [phone]

## 2020-04-27 ENCOUNTER — Telehealth: Payer: Self-pay | Admitting: Adult Health

## 2020-04-27 ENCOUNTER — Other Ambulatory Visit: Payer: Self-pay | Admitting: Adult Health

## 2020-04-27 DIAGNOSIS — U071 COVID-19: Secondary | ICD-10-CM

## 2020-04-27 NOTE — Progress Notes (Signed)
I connected by phone with Roger Shelter on 04/27/2020 at 10:08 AM to discuss the potential use of a new treatment for mild to moderate COVID-19 viral infection in non-hospitalized patients.  This patient is a 44 y.o. female that meets the FDA criteria for Emergency Use Authorization of COVID monoclonal antibody casirivimab/imdevimab.  Has a (+) direct SARS-CoV-2 viral test result  Has mild or moderate COVID-19   Is NOT hospitalized due to COVID-19  Is within 10 days of symptom onset  Has at least one of the high risk factor(s) for progression to severe COVID-19 and/or hospitalization as defined in EUA.  Specific high risk criteria : BMI > 25   I have spoken and communicated the following to the patient or parent/caregiver regarding COVID monoclonal antibody treatment:  1. FDA has authorized the emergency use for the treatment of mild to moderate COVID-19 in adults and pediatric patients with positive results of direct SARS-CoV-2 viral testing who are 39 years of age and older weighing at least 40 kg, and who are at high risk for progressing to severe COVID-19 and/or hospitalization.  2. The significant known and potential risks and benefits of COVID monoclonal antibody, and the extent to which such potential risks and benefits are unknown.  3. Information on available alternative treatments and the risks and benefits of those alternatives, including clinical trials.  4. Patients treated with COVID monoclonal antibody should continue to self-isolate and use infection control measures (e.g., wear mask, isolate, social distance, avoid sharing personal items, clean and disinfect "high touch" surfaces, and frequent handwashing) according to CDC guidelines.   5. The patient or parent/caregiver has the option to accept or refuse COVID monoclonal antibody treatment.  After reviewing this information with the patient, The patient agreed to proceed with receiving casirivimab\imdevimab infusion  and will be provided a copy of the Fact sheet prior to receiving the infusion. Noreene Filbert 04/27/2020 10:08 AM

## 2020-04-27 NOTE — Telephone Encounter (Signed)
Called and LMOM regarding monoclonal antibody treatment for COVID 19 given to those who are at risk for complications and/or hospitalization of the virus.  Patient meets criteria based on: BMI greater than 25  Call back number given: [phone]  My chart message: sent  Kyleen Villatoro, NP  

## 2020-04-28 MED ORDER — SODIUM CHLORIDE 0.9 % IV SOLN
1200.0000 mg | Freq: Once | INTRAVENOUS | Status: AC
  Administered 2020-04-29: 1200 mg via INTRAVENOUS
  Filled 2020-04-28: qty 1200

## 2020-04-29 ENCOUNTER — Ambulatory Visit (HOSPITAL_COMMUNITY)
Admission: RE | Admit: 2020-04-29 | Discharge: 2020-04-29 | Disposition: A | Discharge status: Home / Self Care | Payer: BC Managed Care – PPO | Source: Ambulatory Visit | Attending: Pulmonary Disease | Admitting: Pulmonary Disease

## 2020-04-29 DIAGNOSIS — U071 COVID-19: Secondary | ICD-10-CM | POA: Diagnosis not present

## 2020-04-29 MED ORDER — DIPHENHYDRAMINE HCL 50 MG/ML IJ SOLN: 50.0000 mg | Freq: Once | INTRAMUSCULAR | Status: DC | PRN

## 2020-04-29 MED ORDER — FAMOTIDINE IN NACL 20-0.9 MG/50ML-% IV SOLN: 20.0000 mg | Freq: Once | INTRAVENOUS | Status: DC | PRN

## 2020-04-29 MED ORDER — METHYLPREDNISOLONE SODIUM SUCC 125 MG IJ SOLR: 125.0000 mg | Freq: Once | INTRAMUSCULAR | Status: DC | PRN

## 2020-04-29 MED ORDER — EPINEPHRINE 0.3 MG/0.3ML IJ SOAJ: 0.3000 mg | Freq: Once | INTRAMUSCULAR | Status: DC | PRN

## 2020-04-29 MED ORDER — SODIUM CHLORIDE 0.9 % IV SOLN: INTRAVENOUS | Status: DC | PRN

## 2020-04-29 MED ORDER — ALBUTEROL SULFATE HFA 108 (90 BASE) MCG/ACT IN AERS: 2.0000 | INHALATION_SPRAY | Freq: Once | RESPIRATORY_TRACT | Status: DC | PRN

## 2020-04-29 NOTE — Progress Notes (Signed)
  Diagnosis: COVID-19  Physician: dr. Patrick Wright  Procedure: [facility] Infusion [facility] Med: casirivimab\imdevimab infusion - Provided patient with casirivimab\imdevimab fact sheet for patients, parents and caregivers prior to infusion.  Complications: No immediate complications noted.  Discharge: Discharged home   Adrinne Sze S Beaux Wedemeyer 04/29/2020  

## 2020-04-29 NOTE — Discharge Instructions (Signed)

## 2020-06-09 ENCOUNTER — Ambulatory Visit: Payer: BC Managed Care – PPO | Admitting: Medical

## 2020-06-09 ENCOUNTER — Encounter: Payer: Self-pay | Admitting: Medical

## 2020-06-09 ENCOUNTER — Other Ambulatory Visit: Payer: Self-pay

## 2020-06-09 VITALS — BP 122/72 | HR 71 | Ht 63.0 in | Wt 157.0 lb

## 2020-06-09 DIAGNOSIS — R5383 Other fatigue: Secondary | ICD-10-CM | POA: Diagnosis not present

## 2020-06-09 DIAGNOSIS — R079 Chest pain, unspecified: Secondary | ICD-10-CM

## 2020-06-09 DIAGNOSIS — Z8616 Personal history of COVID-19: Secondary | ICD-10-CM

## 2020-06-09 DIAGNOSIS — Z8249 Family history of ischemic heart disease and other diseases of the circulatory system: Secondary | ICD-10-CM | POA: Insufficient documentation

## 2020-06-09 NOTE — Progress Notes (Signed)
Subjective:  Robin Escobar is a 44 y.o. female who presents for Chief Complaint  Patient presents with  . Chest Pain    wants labs done-patient is fasting      Here today for concern about chest pain.  She had Covid back in August.  She had a rough bout of it feeling sick for several weeks.  She still feels fatigued.  She notes having a rough time with that including fevers, nausea, no smell or taste, headache, back pain, cough.  She did get infusion therapy.  She never got particular short of breath or wheezy.  She did have some blood-tinged sputum at times.  Overall much improved except for still fatigued and having chest pains.  She notes having some intermittent chest pains even before Covid but they have gotten more frequent.  Now they are happening almost daily.  The chest pains are brief lasting for seconds.  Sometimes may get associated dizziness or short of breath occasionally but not every time.  These episodes are brief.  They are central of the chest  No associated sweats, no syncope, no arm pain or tingling.  She does have a history of anxiety and stress.  She does not think that is playing a role.  She also denies any concern for GERD  She is a former smoker, has not smoked in years  No other aggravating or relieving factors.    No other c/o.  The following portions of the patient's history were reviewed and updated as appropriate: allergies, current medications, past family history, past medical history, past social history, past surgical history and problem list.  ROS Otherwise as in subjective above  Objective: BP 122/72   Pulse 71   Ht 5\' 3"  (1.6 m)   Wt 157 lb (71.2 kg)   SpO2 98%   BMI 27.81 kg/m   General appearance: alert, no distress, well developed, well nourished Neck: supple, no lymphadenopathy, no thyromegaly, no masses, no JVD or bruit Heart: RRR, normal S1, S2, no murmurs Lungs: CTA bilaterally, no wheezes, rhonchi, or rales Abdomen: +bs,  soft, non tender, non distended, no masses, no hepatomegaly, no splenomegaly Pulses: 2+ radial pulses, 2+ pedal pulses, normal cap refill Ext: no edema   Assessment: Encounter Diagnoses  Name Primary?  . Chest pain, unspecified type Yes  . Family history of premature CAD   . History of COVID-19   . Fatigue, unspecified type      Plan: Unfortunately our EKG machine was not working today.  So we will have to defer this  Labs today for further evaluation  We may have our EKG working back tomorrow if so she can come back tomorrow or within the next 2 days for EKG  We also talked about getting a baseline chest x-ray  We discussed possible referral to cardiology in general given her family history of CAD, particularly if her EKG machine is not available within the next 48 hours  Otherwise go to emergency department if acute worsening symptoms  Laycie was seen today for chest pain.  Diagnoses and all orders for this visit:  Chest pain, unspecified type -     Basic metabolic panel -     CBC with Differential/Platelet -     High sensitivity CRP  Family history of premature CAD -     Basic metabolic panel -     CBC with Differential/Platelet -     High sensitivity CRP  History of COVID-19  Fatigue, unspecified type  Follow up: pending labs

## 2020-06-10 ENCOUNTER — Emergency Department (HOSPITAL_COMMUNITY): Payer: BC Managed Care – PPO

## 2020-06-10 ENCOUNTER — Other Ambulatory Visit: Payer: Self-pay

## 2020-06-10 ENCOUNTER — Emergency Department (HOSPITAL_COMMUNITY)
Admission: EM | Admit: 2020-06-10 | Discharge: 2020-06-11 | Disposition: A | Discharge status: Home / Self Care | Payer: BC Managed Care – PPO | Attending: Emergency Medicine | Admitting: Emergency Medicine

## 2020-06-10 ENCOUNTER — Encounter (HOSPITAL_COMMUNITY): Payer: Self-pay | Admitting: *Deleted

## 2020-06-10 DIAGNOSIS — R079 Chest pain, unspecified: Secondary | ICD-10-CM | POA: Diagnosis not present

## 2020-06-10 DIAGNOSIS — R0789 Other chest pain: Secondary | ICD-10-CM | POA: Diagnosis not present

## 2020-06-10 DIAGNOSIS — Z87891 Personal history of nicotine dependence: Secondary | ICD-10-CM | POA: Diagnosis not present

## 2020-06-10 DIAGNOSIS — U071 COVID-19: Secondary | ICD-10-CM | POA: Diagnosis not present

## 2020-06-10 LAB — CBC
HCT: 36.6 % (ref 36.0–46.0)
Hemoglobin: 11.5 g/dL — ABNORMAL LOW (ref 12.0–15.0)
MCH: 29.3 pg (ref 26.0–34.0)
MCHC: 31.4 g/dL (ref 30.0–36.0)
MCV: 93.1 fL (ref 80.0–100.0)
Platelets: 368 10*3/uL (ref 150–400)
RBC: 3.93 MIL/uL (ref 3.87–5.11)
RDW: 13.2 % (ref 11.5–15.5)
WBC: 7.6 10*3/uL (ref 4.0–10.5)
nRBC: 0 % (ref 0.0–0.2)

## 2020-06-10 LAB — CBC WITH DIFFERENTIAL/PLATELET
Basophils Absolute: 0.1 10*3/uL (ref 0.0–0.2)
Basos: 1 %
EOS (ABSOLUTE): 0.1 10*3/uL (ref 0.0–0.4)
Eos: 2 %
Hematocrit: 37.1 % (ref 34.0–46.6)
Hemoglobin: 12.2 g/dL (ref 11.1–15.9)
Immature Grans (Abs): 0 10*3/uL (ref 0.0–0.1)
Immature Granulocytes: 0 %
Lymphocytes Absolute: 2.3 10*3/uL (ref 0.7–3.1)
Lymphs: 27 %
MCH: 29.7 pg (ref 26.6–33.0)
MCHC: 32.9 g/dL (ref 31.5–35.7)
MCV: 90 fL (ref 79–97)
Monocytes Absolute: 0.7 10*3/uL (ref 0.1–0.9)
Monocytes: 8 %
Neutrophils Absolute: 5.3 10*3/uL (ref 1.4–7.0)
Neutrophils: 62 %
Platelets: 371 10*3/uL (ref 150–450)
RBC: 4.11 x10E6/uL (ref 3.77–5.28)
RDW: 12.9 % (ref 11.7–15.4)
WBC: 8.4 10*3/uL (ref 3.4–10.8)

## 2020-06-10 LAB — BASIC METABOLIC PANEL
Anion gap: 9 (ref 5–15)
BUN/Creatinine Ratio: 13 (ref 9–23)
BUN: 8 mg/dL (ref 6–24)
BUN: 9 mg/dL (ref 6–20)
CO2: 23 mmol/L (ref 20–29)
CO2: 26 mmol/L (ref 22–32)
Calcium: 9.1 mg/dL (ref 8.7–10.2)
Calcium: 9 mg/dL (ref 8.9–10.3)
Chloride: 103 mmol/L (ref 96–106)
Chloride: 104 mmol/L (ref 98–111)
Creatinine, Ser: 0.63 mg/dL (ref 0.57–1.00)
Creatinine, Ser: 0.99 mg/dL (ref 0.44–1.00)
GFR calc Af Amer: 126 mL/min/{1.73_m2} (ref >59)
GFR calc Af Amer: >60 mL/min (ref >60)
GFR calc non Af Amer: 109 mL/min/{1.73_m2} (ref >59)
GFR calc non Af Amer: >60 mL/min (ref >60)
Glucose, Bld: 105 mg/dL — ABNORMAL HIGH (ref 70–99)
Glucose: 86 mg/dL (ref 65–99)
Potassium: 4.3 mmol/L (ref 3.5–5.2)
Potassium: 3.8 mmol/L (ref 3.5–5.1)
Sodium: 139 mmol/L (ref 134–144)
Sodium: 139 mmol/L (ref 135–145)

## 2020-06-10 LAB — TROPONIN I (HIGH SENSITIVITY): Troponin I (High Sensitivity): <2 ng/L (ref <18)

## 2020-06-10 LAB — HIGH SENSITIVITY CRP: CRP, High Sensitivity: 2.01 mg/L (ref 0.00–3.00)

## 2020-06-10 NOTE — ED Triage Notes (Signed)
Pt reports mid chest pain for several days. Describes it as a dull pain, has sob when the pain occurs. No distress noted.

## 2020-06-11 ENCOUNTER — Telehealth: Payer: Self-pay

## 2020-06-11 ENCOUNTER — Other Ambulatory Visit: Payer: BC Managed Care – PPO

## 2020-06-11 LAB — TROPONIN I (HIGH SENSITIVITY): Troponin I (High Sensitivity): <2 ng/L (ref <18)

## 2020-06-11 NOTE — Telephone Encounter (Signed)
Pt. Called stating she went to the ER last night because she started hurting. She canceled her apt today for an EKG because she just had one done last night at [facility] and other test done as well. She wanted to know if you could review what she had done last night and see what she is supposed to do now.

## 2020-06-11 NOTE — ED Provider Notes (Signed)
[facility] EMERGENCY DEPARTMENT Provider Note   CSN: [id number] Arrival date & time: 06/10/20  2153     History Chief Complaint  Patient presents with  . Chest Pain    Robin Escobar is a 44 y.o. female.  HPI  HPI: A 44 year old patient presents for evaluation of chest pain. Initial onset of pain was more than 6 hours ago. The patient's chest pain is described as heaviness/pressure/tightness and is not worse with exertion. The patient complains of nausea. The patient's chest pain is middle- or left-sided, is not well-localized, is not sharp and does not radiate to the arms/jaw/neck. The patient denies diaphoresis. The patient has no history of stroke, has no history of peripheral artery disease, has not smoked in the past 90 days, denies any history of treated diabetes, has no relevant family history of coronary artery disease (first degree relative at less than age 41), is not hypertensive, has no history of hypercholesterolemia and does not have an elevated BMI (>=30).  Patient states she has been having pressure this past week.  Symptoms have been coming and going and she was going to see her doctor this morning.  Yesterday however the symptoms were pretty much constant.  She did have some nausea with it.  Occasionally some positions seem to make it better or worse.  No abdominal pain.  No fevers or chills.  No history of heart disease. Past Medical History:  Diagnosis Date  . Abnormal Pap smear of cervix    LGSIL 3/08  . Dysthymia   . Former smoker   . GAD (generalized anxiety disorder)     Patient Active Problem List   Diagnosis Date Noted  . History of COVID-19 06/09/2020  . Family history of premature CAD 06/09/2020  . Fatigue 06/09/2020  . Right cervical radiculopathy 05/26/2016  . Neck pain 05/06/2016  . Radicular pain of right upper extremity 05/06/2016  . Pain in the chest 05/06/2016  . Former smoker 05/06/2016  . Anxiety state, unspecified  05/03/2011  . Tobacco use disorder 05/03/2011    Past Surgical History:  Procedure Laterality Date  . TUBAL LIGATION       OB History    Gravida  5   Para  2   Term      Preterm      AB  3   Living  2     SAB  2   TAB  1   Ectopic      Multiple      Live Births              Family History  Problem Relation Age of Onset  . Seizures Mother   . Stroke Mother   . Heart disease Father 69       died of MI  . ADD / ADHD Daughter   . ADD / ADHD Son   . Diabetes Maternal Grandfather   . Stroke Maternal Grandfather   . Alzheimer's disease Paternal Grandmother     Social History   Tobacco Use  . Smoking status: Former Smoker    Packs/day: 1.00    Types: Cigarettes  . Smokeless tobacco: Never Used  . Tobacco comment: has cut back; allergic to patch; didn't tolerate Chantix secondary to nausea  Substance Use Topics  . Alcohol use: Yes    Alcohol/week: 0.0 standard drinks    Comment: occasionally.  . Drug use: No    Home Medications Prior to Admission medications   Not  on File    Allergies    Patient has no known allergies.  Review of Systems   Review of Systems  All other systems reviewed and are negative.   Physical Exam Updated Vital Signs BP 116/84 (BP Location: Right Arm)   Pulse (!) 109   Temp 97.9 F (36.6 C) (Oral)   Resp 18   LMP 06/10/2020   SpO2 94%   Physical Exam Vitals and nursing note reviewed.  Constitutional:      General: She is not in acute distress.    Appearance: She is well-developed.  HENT:     Head: Normocephalic and atraumatic.     Right Ear: External ear normal.     Left Ear: External ear normal.  Eyes:     General: No scleral icterus.       Right eye: No discharge.        Left eye: No discharge.     Conjunctiva/sclera: Conjunctivae normal.  Neck:     Trachea: No tracheal deviation.  Cardiovascular:     Rate and Rhythm: Normal rate and regular rhythm.  Pulmonary:     Effort: Pulmonary effort is  normal. No respiratory distress.     Breath sounds: Normal breath sounds. No stridor. No wheezing or rales.  Abdominal:     General: Bowel sounds are normal. There is no distension.     Palpations: Abdomen is soft.     Tenderness: There is no abdominal tenderness. There is no guarding or rebound.  Musculoskeletal:        General: No tenderness.     Cervical back: Neck supple.  Skin:    General: Skin is warm and dry.     Findings: No rash.  Neurological:     Mental Status: She is alert.     Cranial Nerves: No cranial nerve deficit (no facial droop, extraocular movements intact, no slurred speech).     Sensory: No sensory deficit.     Motor: No abnormal muscle tone or seizure activity.     Coordination: Coordination normal.     ED Results / Procedures / Treatments   Labs (all labs ordered are listed, but only abnormal results are displayed) Labs Reviewed  BASIC METABOLIC PANEL - Abnormal; Notable for the following components:      Result Value   Glucose, Bld 105 (*)    All other components within normal limits  CBC - Abnormal; Notable for the following components:   Hemoglobin 11.5 (*)    All other components within normal limits  I-STAT BETA HCG BLOOD, ED ([facility], [facility], [facility] ONLY)  TROPONIN I (HIGH SENSITIVITY)  TROPONIN I (HIGH SENSITIVITY)    EKG EKG Interpretation  Date/Time:  Tuesday June 10 2020 22:00:49 EDT Ventricular Rate:  83 PR Interval:  126 QRS Duration: 72 QT Interval:  370 QTC Calculation: 434 R Axis:   73 Text Interpretation: Normal sinus rhythm with sinus arrhythmia Normal ECG When compared with ECG of 10/05/2010, HEART RATE has decreased Confirmed by Dione Booze ([id number]) on 06/11/2020 1:14:25 AM   Radiology DG Chest 2 View  Result Date: 06/10/2020 CLINICAL DATA:  Chest pain. EXAM: CHEST - 2 VIEW COMPARISON:  October 05, 2010 FINDINGS: The heart size and mediastinal contours are within normal limits. Both lungs are clear. The visualized skeletal  structures are unremarkable. IMPRESSION: No active cardiopulmonary disease. Electronically Signed   By: Aram Candela M.D.   On: 06/10/2020 22:27    Procedures Procedures (including critical care time)  Medications  Ordered in ED Medications - No data to display  ED Course  I have reviewed the triage vital signs and the nursing notes.  Pertinent labs & imaging results that were available during my care of the patient were reviewed by me and considered in my medical decision making (see chart for details).    MDM Rules/Calculators/A&P HEAR Score: 1                        Patient presented to ED for evaluation of chest pains.  Symptoms ongoing for greater than 24 hours.  ED work-up is reassuring.  Serial troponins are normal.  Chest x-ray without abnormalities.  Laboratory tests are otherwise normal.  No findings to suggest pneumonia, pneumothorax, low suspicion for PE, doubt ACS.  Symptoms not suggestive of GERD but I do think it is reasonable to try a week of Prilosec to see if that helps with her symptoms.  Discussed outpatient follow-up with PCP. Final Clinical Impression(s) / ED Diagnoses Final diagnoses:  Chest pain, unspecified type    Rx / DC Orders ED Discharge Orders    None       Linwood Dibbles, MD 06/11/20 [phone]

## 2020-06-11 NOTE — Discharge Instructions (Addendum)
Take prilosec 20 mg daily for the next week to see if that helps with your symptoms.  You can get over the counter prilosec. Follow up with your doctor for further evaluation if the symptoms persist

## 2020-06-12 NOTE — Telephone Encounter (Signed)
Patient has been informed.

## 2020-06-12 NOTE — Telephone Encounter (Signed)
I reviewed the hospital report, EKG, chest xray , lab.   They wanted her to try a week of Prilosec.  I would also add tylenol 500mg  over the counter, twice daily for 5 days.  Lets try this first as they found no worrisome heart findings and no abnormity in lungs on xray

## 2021-04-30 ENCOUNTER — Other Ambulatory Visit: Payer: Self-pay

## 2021-04-30 ENCOUNTER — Ambulatory Visit: Payer: BC Managed Care – PPO | Admitting: Medical

## 2021-04-30 VITALS — BP 102/68 | HR 75 | Temp 97.6°F | Wt 165.0 lb

## 2021-04-30 DIAGNOSIS — R0981 Nasal congestion: Secondary | ICD-10-CM

## 2021-04-30 DIAGNOSIS — R490 Dysphonia: Secondary | ICD-10-CM | POA: Diagnosis not present

## 2021-04-30 DIAGNOSIS — J069 Acute upper respiratory infection, unspecified: Secondary | ICD-10-CM | POA: Diagnosis not present

## 2021-04-30 MED ORDER — OMEPRAZOLE 40 MG PO CPDR: 40.0000 mg | DELAYED_RELEASE_CAPSULE | Freq: Every day | ORAL | 0 refills | Status: DC

## 2021-04-30 MED ORDER — BENZONATATE 100 MG PO CAPS: 100.0000 mg | ORAL_CAPSULE | Freq: Two times a day (BID) | ORAL | 0 refills | Status: DC | PRN

## 2021-04-30 MED ORDER — AMOXICILLIN 875 MG PO TABS: 875.0000 mg | ORAL_TABLET | Freq: Two times a day (BID) | ORAL | 0 refills | Status: AC

## 2021-04-30 MED ORDER — CETIRIZINE HCL 10 MG PO TABS: 10.0000 mg | ORAL_TABLET | Freq: Every day | ORAL | 0 refills | Status: DC

## 2021-04-30 NOTE — Progress Notes (Signed)
Subjective:  Robin Escobar is a 45 y.o. female who presents for Chief Complaint  Patient presents with   Laryngitis    Voice comes and goes for about a month     Here for loss of voice.  She notes having laryngitis in the past month, but voice is still raspy, comes and goes.   Initial symptoms were sinus congestion, sinus pressure, sore throat, then few days later voice was gone.  Illness lasted a week, but the voice issues have persisted.   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  Using nothing currently for symptoms.  She does have some ongoing sinus congestion.  She is a non-smoker.  She does eat a variety of foods and not sure about acid reflux but does not have any current heartburn or belching.  No alcohol use.  Otherwise has been in normal state of health  No other aggravating or relieving factors.    No other c/o.  The following portions of the patient's history were reviewed and updated as appropriate: allergies, current medications, past family history, past medical history, past social history, past surgical history and problem list.  ROS Otherwise as in subjective above  Objective: BP 102/68   Pulse 75   Temp 97.6 F (36.4 C)   Wt 165 lb (74.8 kg)   SpO2 98%   BMI 29.23 kg/m   General appearance: alert, no distress, well developed, well nourished HEENT: normocephalic, sclerae anicteric, conjunctiva pink and moist, TMs pearly, nares patent, no discharge or erythema, pharynx normal Oral cavity: MMM, no lesions Neck: supple, no lymphadenopathy, no thyromegaly, no masses Heart: RRR, normal S1, S2, no murmurs Lungs: CTA bilaterally, no wheezes, rhonchi, or rales Abdomen: +bs, soft, non tender, non distended, no masses, no hepatomegaly, no splenomegaly Pulses: 2+ radial pulses, 2+ pedal pulses, normal cap refill Ext: no edema   Assessment: Encounter Diagnoses  Name Primary?   Hoarse voice quality Yes   Recent URI    Sinus congestion       Plan: We discussed, and less common causes of hoarse voice and laryngitis.  Her voice to me today is minimally hoarse.  I suspect she had the laryngitis illness followed by ongoing drainage or possibly even some reflux.  We discussed the following recommendations which she will use for now.  If not resolved within 2 weeks or if ongoing symptoms without improvements at all, then we can consider ENT consult if symptoms persist   Recommendations Begin omeprazole 40 mg acid reflux pill first thing in the morning on empty stomach preferably 30 to 45 minutes before breakfast.  Use the omeprazole for the next 2 weeks Begin cetirizine Zyrtec 10 mg at bedtime for the next 2 weeks Begin Amoxicillin antibiotic twice daily for 10 days for lingering sinus congestion I recommend a honey, hot water and lemon mixture in the morning to clear the throat and clear mucus from the throat I recommend nasal saline flush with salt water and salt water gargles at bedtime or in the shower before bedtime for the next 2 weeks Rest your voice when possible for the next week Drink plenty of water every day such as 80 to 100 ounces of water daily Limit or avoid foods that can make acid reflux worse  [name] was seen today for laryngitis.  Diagnoses and all orders for this visit:  Hoarse voice quality  Recent URI  Sinus congestion  Other orders -  cetirizine (ZYRTEC) 10 MG tablet; Take 1 tablet (10 mg total) by mouth at bedtime. -     omeprazole (PRILOSEC) 40 MG capsule; Take 1 capsule (40 mg total) by mouth daily. -     benzonatate (TESSALON) 100 MG capsule; Take 1 capsule (100 mg total) by mouth 2 (two) times daily as needed for cough. -     amoxicillin (AMOXIL) 875 MG tablet; Take 1 tablet (875 mg total) by mouth 2 (two) times daily for 10 days.   Follow up: prn

## 2021-04-30 NOTE — Patient Instructions (Addendum)
Recommendations Begin omeprazole 40 mg acid reflux pill first thing in the morning on empty stomach preferably 30 to 45 minutes before breakfast.  Use the omeprazole for the next 2 weeks Begin cetirizine Zyrtec 10 mg at bedtime for the next 2 weeks Begin Amoxicillin antibiotic twice daily for 10 days for lingering sinus congestion I recommend a honey, hot water and lemon mixture in the morning to clear the throat and clear mucus from the throat I recommend nasal saline flush with salt water and salt water gargles at bedtime or in the shower before bedtime for the next 2 weeks Rest your voice when possible for the next week Drink plenty of water every day such as 80 to 100 ounces of water daily Limit or avoid foods that can make acid reflux worse   Gastroesophageal Reflux Disease, Adult   Gastroesophageal reflux disease (GERD) happens when acid from your stomach flows up into the esophagus. When acid comes in contact with the esophagus, the acid causes soreness (inflammation) in the esophagus. Over time, GERD may create small holes (ulcers) in the lining of the esophagus.  CAUSES  Increased body weight. This puts pressure on the stomach, making acid rise from the stomach into the esophagus.  Smoking. This increases acid production in the stomach.  Drinking alcohol. This causes decreased pressure in the lower esophageal sphincter (valve or ring of muscle between the esophagus and stomach), allowing acid from the stomach into the esophagus.  Late evening meals and a full stomach. This increases pressure and acid production in the stomach.  A malformed lower esophageal sphincter.  Sometimes, no cause is found.  SYMPTOMS  Burning pain in the lower part of the mid-chest behind the breastbone and in the mid-stomach area. This may occur twice a week or more often.  Trouble swallowing.  Sore throat.  Dry cough.  Asthma-like symptoms including chest tightness, shortness of breath, or wheezing.    DIAGNOSIS  Your caregiver may be able to diagnose GERD based on your symptoms. In some cases, X-rays and other tests may be done to check for complications or to check the condition of your stomach and esophagus.  HOME CARE INSTRUCTIONS  Change the factors that you can control. Ask your caregiver for guidance concerning weight loss, quitting smoking, and alcohol consumption.  Avoid foods and drinks that make your symptoms worse, such as:  Caffeine or alcoholic drinks.  Chocolate.  Peppermint or mint flavorings.  Garlic and onions.  Spicy foods.  Citrus fruits, such as oranges, lemons, or limes.  Tomato-based foods such as sauce, chili, salsa, and pizza.  Fried and fatty foods.  Avoid lying down for the 3 hours prior to your bedtime or prior to taking a nap.  Eat small, frequent meals instead of large meals.  Wear loose-fitting clothing. Do not wear anything tight around your waist that causes pressure on your stomach.  Raise the head of your bed 6 to 8 inches with wood blocks to help you sleep. Extra pillows will not help.  Only take over-the-counter or prescription medicines for pain, discomfort, or fever as directed by your caregiver.  Do not take aspirin, ibuprofen, or other nonsteroidal anti-inflammatory drugs (NSAIDs).   SEEK IMMEDIATE MEDICAL CARE IF:  You have pain in your arms, neck, jaw, teeth, or back.  Your pain increases or changes in intensity or duration.  You develop nausea, vomiting, or sweating (diaphoresis).  You develop shortness of breath, or you faint.  Your vomit is green, yellow, black, or  looks like coffee grounds or blood.  Your stool is red, bloody, or black.  These symptoms could be signs of other problems, such as heart disease, gastric bleeding, or esophageal bleeding. MAKE SURE YOU:  Understand these instructions.  Will watch your condition.  Will get help right away if you are not doing well or get worse.  Document Released: 06/16/2005 Document  Revised: 05/19/2011 Document Reviewed: 03/26/2011 [facility] Patient Information 2012 [place], [place].

## 2021-06-02 ENCOUNTER — Other Ambulatory Visit: Payer: Self-pay | Admitting: Medical

## 2021-06-02 ENCOUNTER — Encounter: Payer: Self-pay | Admitting: Internal Medicine

## 2021-06-02 ENCOUNTER — Telehealth: Payer: Self-pay | Admitting: Medical

## 2021-06-02 MED ORDER — CETIRIZINE HCL 10 MG PO TABS
10.0000 mg | ORAL_TABLET | Freq: Every day | ORAL | 0 refills | Status: DC
Start: 2021-06-02 — End: 2021-08-28

## 2021-06-02 NOTE — Telephone Encounter (Signed)
Please call her.  We got a refill request today on some routine medications.  The nurse protocol is to call patient if her last visit was greater than 1 year ago to get them on the schedule for a physical which is what prompted that called this morning.  Although refilling her Prilosec is not necessary related to her wellness visit request, she is in fact due for a physical.  Last fasting labs and physical greater than 1 year ago.  So please go ahead and ask her to make a physical appointment including fasting labs  Medicine refills sent  Thanks  Vincenza Hews

## 2021-06-02 NOTE — Telephone Encounter (Signed)
We got a refill on her omeprazole. Pt was also advised that she was overdue for a physical as she hasn't had one and I asked if she had a pap within the last 3-5 years. She said those medications had nothing with needing a pap smear or physical and if she wanted to scheduled we could schedule a physical with pap. She only wanted her medication refilled and said she was not going to schedule a visit. You never told her this at her last virtual visit. Just FYI. I only refilled her meds for 30 days

## 2021-06-03 NOTE — Telephone Encounter (Signed)
Unable to reach pt due to voicemail not being set up. Mychart message sent

## 2021-07-02 ENCOUNTER — Other Ambulatory Visit: Payer: Self-pay | Admitting: Medical

## 2021-08-27 ENCOUNTER — Encounter: Payer: Self-pay | Admitting: Internal Medicine

## 2021-08-28 ENCOUNTER — Ambulatory Visit: Payer: BC Managed Care – PPO | Admitting: Medical

## 2021-08-28 ENCOUNTER — Encounter: Payer: Self-pay | Admitting: Medical

## 2021-08-28 ENCOUNTER — Other Ambulatory Visit: Payer: Self-pay

## 2021-08-28 VITALS — BP 110/70 | HR 73 | Temp 97.8°F | Wt 162.8 lb

## 2021-08-28 DIAGNOSIS — M542 Cervicalgia: Secondary | ICD-10-CM | POA: Diagnosis not present

## 2021-08-28 DIAGNOSIS — M549 Dorsalgia, unspecified: Secondary | ICD-10-CM | POA: Diagnosis not present

## 2021-08-28 DIAGNOSIS — M79602 Pain in left arm: Secondary | ICD-10-CM

## 2021-08-28 DIAGNOSIS — M792 Neuralgia and neuritis, unspecified: Secondary | ICD-10-CM | POA: Diagnosis not present

## 2021-08-28 MED ORDER — TIZANIDINE HCL 4 MG PO TABS: 4.0000 mg | ORAL_TABLET | Freq: Two times a day (BID) | ORAL | 0 refills | Status: DC | PRN

## 2021-08-28 MED ORDER — HYDROCODONE-ACETAMINOPHEN 5-325 MG PO TABS
1.0000 | ORAL_TABLET | Freq: Four times a day (QID) | ORAL | 0 refills | Status: DC | PRN
Start: 2021-08-28 — End: 2022-11-23

## 2021-08-28 MED ORDER — PREDNISONE 10 MG PO TABS: ORAL_TABLET | ORAL | 0 refills | Status: DC

## 2021-08-28 NOTE — Progress Notes (Signed)
Subjective:  Robin Escobar is a 45 y.o. female who presents for Chief Complaint  Patient presents with   left arm pain    Left arm pain- before thanksgiving. Nagging pain, lifting arm she feels the pain     Here for a few week history of neck pain, left upper back and arm pain.  She denies injury trauma or fall.  No recent strenuous activity.  She notes left upper back pain, left arm pain, muscle spasm.  Felt a little better with massage by her husband.  Sometimes feels numb or tingling.   Sometimes jolt of pain down the arm   pain has been constant since Thanksgiving.  Has used ibuprofen, aleve, tylenol.  Nothing helps.  No fever no rash no swelling..  No distinct numbness in 1 or 2 fingers but bowel wall can feel numb at times.  Right handed.  No other aggravating or relieving factors.  No other c/o.  The following portions of the patient's history were reviewed and updated as appropriate: allergies, current medications, past family history, past medical history, past social history, past surgical history and problem list.  ROS Otherwise as in subjective above  Objective: BP 110/70   Pulse 73   Temp 97.8 F (36.6 C)   Wt 162 lb 12.8 oz (73.8 kg)   BMI 28.84 kg/m   General appearance: alert, no distress, well developed, well nourished Neck: supple, mild generalized tenderness laterally, ROM about 90% in all directions, limited with pain, otherwise no lymphadenopathy, no thyromegaly, no masses Left arm without obvious tenderness but she notes pain throughout the arm.  She gets pain particularly with shoulder range of motion over 70 degrees in any direction.  Range of motion is otherwise full.  No swelling or deformity. Arms neurovascularly intact, grip strength normal, DTRs normal Pulses: 2+ radial pulses, 2+ pedal pulses, normal cap refill Ext: no edema    Assessment: Encounter Diagnoses  Name Primary?   Neck pain Yes   Left arm pain    Radicular pain in left arm    Upper  back pain      Plan: We discussed possible differential which could include muscle strain and spasm of the neck and shoulder girdle and upper back versus ruptured disc or pinched nerve.  I suspect she may actually have cervical spine radicular issue such as bulging disc.  We will begin trial of medication and physical therapy after beginning the medication over the weekend.  Follow-up in 3 to 4 weeks.  If not fully resolved or much improvement at time plan will likely need to pursue MRI  Patient Instructions  Your symptoms and exam suggest strain vs pinched nerve in neck causing arm pains  Recommendations: I prescribed 3 medications today Begin prednisone steroid taper to reduce inflammation and pain.  This is a week Dosepak Begin tizanidine muscle relaxer once or twice daily as needed for muscle spasm and strain.  Caution as this can make you sleepy If you have worse pain over the weekend you can also use hydrocodone narcotic pain medication short-term for severe pain.  This could also make you sleepy or constipated so only use it if absolutely needed. Consider either massage therapy by licensed massage therapist or I can refer you to physical therapy for this issue as well If not much improved over the next 3-4 weeks, we may need to pursue and MRI of your cervical spine  If this is just a spasm and strain, you should feel much  improvement or complete improvement in 3-4 weeks     [name] was seen today for left arm pain.  Diagnoses and all orders for this visit:  Neck pain  Left arm pain  Radicular pain in left arm  Upper back pain  Other orders -     HYDROcodone-acetaminophen (NORCO) 5-325 MG tablet; Take 1 tablet by mouth every 6 (six) hours as needed. -     tiZANidine (ZANAFLEX) 4 MG tablet; Take 1 tablet (4 mg total) by mouth 2 (two) times daily as needed for muscle spasms. -     predniSONE (DELTASONE) 10 MG tablet; 6 tablets all together day 1, 5 tablets day 2, 4  tablets day 3, 3 tablets day 4, 2 tablets day 5, 1 tablet day 6.   Follow up: 3-4 weeks

## 2021-08-28 NOTE — Patient Instructions (Signed)
Your symptoms and exam suggest strain vs pinched nerve in neck causing arm pains  Recommendations: I prescribed 3 medications today Begin prednisone steroid taper to reduce inflammation and pain.  This is a week Dosepak Begin tizanidine muscle relaxer once or twice daily as needed for muscle spasm and strain.  Caution as this can make you sleepy If you have worse pain over the weekend you can also use hydrocodone narcotic pain medication short-term for severe pain.  This could also make you sleepy or constipated so only use it if absolutely needed. Consider either massage therapy by licensed massage therapist or I can refer you to physical therapy for this issue as well If not much improved over the next 3-4 weeks, we may need to pursue and MRI of your cervical spine  If this is just a spasm and strain, you should feel much improvement or complete improvement in 3-4 weeks

## 2021-09-23 ENCOUNTER — Telehealth: Payer: Self-pay | Admitting: Internal Medicine

## 2021-09-23 ENCOUNTER — Other Ambulatory Visit: Payer: Self-pay | Admitting: Medical

## 2021-09-23 DIAGNOSIS — M542 Cervicalgia: Secondary | ICD-10-CM

## 2021-09-23 DIAGNOSIS — M5412 Radiculopathy, cervical region: Secondary | ICD-10-CM

## 2021-09-23 DIAGNOSIS — M792 Neuralgia and neuritis, unspecified: Secondary | ICD-10-CM

## 2021-09-23 DIAGNOSIS — M79602 Pain in left arm: Secondary | ICD-10-CM | POA: Diagnosis not present

## 2021-09-23 MED ORDER — GABAPENTIN 100 MG PO CAPS: 100.0000 mg | ORAL_CAPSULE | Freq: Two times a day (BID) | ORAL | 0 refills | Status: DC

## 2021-09-23 NOTE — Telephone Encounter (Signed)
Patient called and just left [facility] and states that the therapist told her to call and see if he could recommend and prescribe gabapentin for her. She said the hydrocodone did well at first but not touching it now and she is having a lot of pain and therapy thinks its nerve related

## 2021-09-23 NOTE — Telephone Encounter (Signed)
Pt is ok with gabapentin and MRI. She does want to know does she continue PT. She just started PT today

## 2021-09-23 NOTE — Telephone Encounter (Signed)
Pt was notified.  

## 2021-09-29 DIAGNOSIS — M542 Cervicalgia: Secondary | ICD-10-CM | POA: Diagnosis not present

## 2021-09-29 DIAGNOSIS — M5412 Radiculopathy, cervical region: Secondary | ICD-10-CM | POA: Diagnosis not present

## 2021-09-29 DIAGNOSIS — M79602 Pain in left arm: Secondary | ICD-10-CM | POA: Diagnosis not present

## 2021-10-06 ENCOUNTER — Ambulatory Visit (HOSPITAL_COMMUNITY)
Admission: RE | Admit: 2021-10-06 | Discharge: 2021-10-06 | Disposition: A | Discharge status: Home / Self Care | Payer: BC Managed Care – PPO | Source: Ambulatory Visit | Attending: Medical | Admitting: Medical

## 2021-10-06 ENCOUNTER — Other Ambulatory Visit: Payer: Self-pay

## 2021-10-06 DIAGNOSIS — M5412 Radiculopathy, cervical region: Secondary | ICD-10-CM | POA: Insufficient documentation

## 2021-10-06 DIAGNOSIS — M542 Cervicalgia: Secondary | ICD-10-CM | POA: Insufficient documentation

## 2021-10-06 DIAGNOSIS — M792 Neuralgia and neuritis, unspecified: Secondary | ICD-10-CM | POA: Diagnosis not present

## 2021-10-06 DIAGNOSIS — M79602 Pain in left arm: Secondary | ICD-10-CM | POA: Diagnosis not present

## 2021-10-06 DIAGNOSIS — M47812 Spondylosis without myelopathy or radiculopathy, cervical region: Secondary | ICD-10-CM | POA: Diagnosis not present

## 2021-10-13 ENCOUNTER — Other Ambulatory Visit: Payer: Self-pay | Admitting: Medical

## 2021-10-13 DIAGNOSIS — M9902 Segmental and somatic dysfunction of thoracic region: Secondary | ICD-10-CM | POA: Diagnosis not present

## 2021-10-13 DIAGNOSIS — M9901 Segmental and somatic dysfunction of cervical region: Secondary | ICD-10-CM | POA: Diagnosis not present

## 2021-10-13 DIAGNOSIS — M5032 Other cervical disc degeneration, mid-cervical region, unspecified level: Secondary | ICD-10-CM | POA: Diagnosis not present

## 2021-10-13 DIAGNOSIS — M4712 Other spondylosis with myelopathy, cervical region: Secondary | ICD-10-CM | POA: Diagnosis not present

## 2021-10-13 MED ORDER — OSELTAMIVIR PHOSPHATE 75 MG PO CAPS: 75.0000 mg | ORAL_CAPSULE | Freq: Every day | ORAL | 0 refills | Status: AC

## 2021-10-14 ENCOUNTER — Ambulatory Visit: Payer: BC Managed Care – PPO | Admitting: Medical

## 2021-10-14 DIAGNOSIS — M5032 Other cervical disc degeneration, mid-cervical region, unspecified level: Secondary | ICD-10-CM | POA: Diagnosis not present

## 2021-10-14 DIAGNOSIS — M4712 Other spondylosis with myelopathy, cervical region: Secondary | ICD-10-CM | POA: Diagnosis not present

## 2021-10-14 DIAGNOSIS — M9902 Segmental and somatic dysfunction of thoracic region: Secondary | ICD-10-CM | POA: Diagnosis not present

## 2021-10-14 DIAGNOSIS — M9901 Segmental and somatic dysfunction of cervical region: Secondary | ICD-10-CM | POA: Diagnosis not present

## 2021-10-20 DIAGNOSIS — M9902 Segmental and somatic dysfunction of thoracic region: Secondary | ICD-10-CM | POA: Diagnosis not present

## 2021-10-20 DIAGNOSIS — M5032 Other cervical disc degeneration, mid-cervical region, unspecified level: Secondary | ICD-10-CM | POA: Diagnosis not present

## 2021-10-20 DIAGNOSIS — M4712 Other spondylosis with myelopathy, cervical region: Secondary | ICD-10-CM | POA: Diagnosis not present

## 2021-10-20 DIAGNOSIS — M9901 Segmental and somatic dysfunction of cervical region: Secondary | ICD-10-CM | POA: Diagnosis not present

## 2021-10-23 DIAGNOSIS — M5032 Other cervical disc degeneration, mid-cervical region, unspecified level: Secondary | ICD-10-CM | POA: Diagnosis not present

## 2021-10-23 DIAGNOSIS — M4712 Other spondylosis with myelopathy, cervical region: Secondary | ICD-10-CM | POA: Diagnosis not present

## 2021-10-23 DIAGNOSIS — M9901 Segmental and somatic dysfunction of cervical region: Secondary | ICD-10-CM | POA: Diagnosis not present

## 2021-10-23 DIAGNOSIS — M9902 Segmental and somatic dysfunction of thoracic region: Secondary | ICD-10-CM | POA: Diagnosis not present

## 2021-10-27 DIAGNOSIS — M9901 Segmental and somatic dysfunction of cervical region: Secondary | ICD-10-CM | POA: Diagnosis not present

## 2021-10-27 DIAGNOSIS — M9902 Segmental and somatic dysfunction of thoracic region: Secondary | ICD-10-CM | POA: Diagnosis not present

## 2021-10-27 DIAGNOSIS — M4712 Other spondylosis with myelopathy, cervical region: Secondary | ICD-10-CM | POA: Diagnosis not present

## 2021-10-27 DIAGNOSIS — M5032 Other cervical disc degeneration, mid-cervical region, unspecified level: Secondary | ICD-10-CM | POA: Diagnosis not present

## 2021-10-30 DIAGNOSIS — M9901 Segmental and somatic dysfunction of cervical region: Secondary | ICD-10-CM | POA: Diagnosis not present

## 2021-10-30 DIAGNOSIS — M4712 Other spondylosis with myelopathy, cervical region: Secondary | ICD-10-CM | POA: Diagnosis not present

## 2021-10-30 DIAGNOSIS — M9902 Segmental and somatic dysfunction of thoracic region: Secondary | ICD-10-CM | POA: Diagnosis not present

## 2021-10-30 DIAGNOSIS — M5032 Other cervical disc degeneration, mid-cervical region, unspecified level: Secondary | ICD-10-CM | POA: Diagnosis not present

## 2021-11-03 DIAGNOSIS — M4712 Other spondylosis with myelopathy, cervical region: Secondary | ICD-10-CM | POA: Diagnosis not present

## 2021-11-03 DIAGNOSIS — M9902 Segmental and somatic dysfunction of thoracic region: Secondary | ICD-10-CM | POA: Diagnosis not present

## 2021-11-03 DIAGNOSIS — M5032 Other cervical disc degeneration, mid-cervical region, unspecified level: Secondary | ICD-10-CM | POA: Diagnosis not present

## 2021-11-03 DIAGNOSIS — M9901 Segmental and somatic dysfunction of cervical region: Secondary | ICD-10-CM | POA: Diagnosis not present

## 2021-11-06 DIAGNOSIS — M9901 Segmental and somatic dysfunction of cervical region: Secondary | ICD-10-CM | POA: Diagnosis not present

## 2021-11-06 DIAGNOSIS — M5032 Other cervical disc degeneration, mid-cervical region, unspecified level: Secondary | ICD-10-CM | POA: Diagnosis not present

## 2021-11-06 DIAGNOSIS — M4712 Other spondylosis with myelopathy, cervical region: Secondary | ICD-10-CM | POA: Diagnosis not present

## 2021-11-06 DIAGNOSIS — M9902 Segmental and somatic dysfunction of thoracic region: Secondary | ICD-10-CM | POA: Diagnosis not present

## 2021-11-10 DIAGNOSIS — M9901 Segmental and somatic dysfunction of cervical region: Secondary | ICD-10-CM | POA: Diagnosis not present

## 2021-11-10 DIAGNOSIS — M4712 Other spondylosis with myelopathy, cervical region: Secondary | ICD-10-CM | POA: Diagnosis not present

## 2021-11-10 DIAGNOSIS — M9902 Segmental and somatic dysfunction of thoracic region: Secondary | ICD-10-CM | POA: Diagnosis not present

## 2021-11-10 DIAGNOSIS — M5032 Other cervical disc degeneration, mid-cervical region, unspecified level: Secondary | ICD-10-CM | POA: Diagnosis not present

## 2021-11-12 DIAGNOSIS — M9901 Segmental and somatic dysfunction of cervical region: Secondary | ICD-10-CM | POA: Diagnosis not present

## 2021-11-12 DIAGNOSIS — M4712 Other spondylosis with myelopathy, cervical region: Secondary | ICD-10-CM | POA: Diagnosis not present

## 2021-11-12 DIAGNOSIS — M9902 Segmental and somatic dysfunction of thoracic region: Secondary | ICD-10-CM | POA: Diagnosis not present

## 2021-11-12 DIAGNOSIS — M5032 Other cervical disc degeneration, mid-cervical region, unspecified level: Secondary | ICD-10-CM | POA: Diagnosis not present

## 2021-11-19 DIAGNOSIS — M4712 Other spondylosis with myelopathy, cervical region: Secondary | ICD-10-CM | POA: Diagnosis not present

## 2021-11-19 DIAGNOSIS — M5032 Other cervical disc degeneration, mid-cervical region, unspecified level: Secondary | ICD-10-CM | POA: Diagnosis not present

## 2021-11-19 DIAGNOSIS — M9902 Segmental and somatic dysfunction of thoracic region: Secondary | ICD-10-CM | POA: Diagnosis not present

## 2021-11-19 DIAGNOSIS — M9901 Segmental and somatic dysfunction of cervical region: Secondary | ICD-10-CM | POA: Diagnosis not present

## 2021-11-25 DIAGNOSIS — M5032 Other cervical disc degeneration, mid-cervical region, unspecified level: Secondary | ICD-10-CM | POA: Diagnosis not present

## 2021-11-25 DIAGNOSIS — M9901 Segmental and somatic dysfunction of cervical region: Secondary | ICD-10-CM | POA: Diagnosis not present

## 2021-11-25 DIAGNOSIS — M4712 Other spondylosis with myelopathy, cervical region: Secondary | ICD-10-CM | POA: Diagnosis not present

## 2021-11-25 DIAGNOSIS — M9902 Segmental and somatic dysfunction of thoracic region: Secondary | ICD-10-CM | POA: Diagnosis not present

## 2021-12-02 DIAGNOSIS — M9902 Segmental and somatic dysfunction of thoracic region: Secondary | ICD-10-CM | POA: Diagnosis not present

## 2021-12-02 DIAGNOSIS — M4712 Other spondylosis with myelopathy, cervical region: Secondary | ICD-10-CM | POA: Diagnosis not present

## 2021-12-02 DIAGNOSIS — M5032 Other cervical disc degeneration, mid-cervical region, unspecified level: Secondary | ICD-10-CM | POA: Diagnosis not present

## 2021-12-02 DIAGNOSIS — M9901 Segmental and somatic dysfunction of cervical region: Secondary | ICD-10-CM | POA: Diagnosis not present

## 2021-12-08 DIAGNOSIS — Z01419 Encounter for gynecological examination (general) (routine) without abnormal findings: Secondary | ICD-10-CM | POA: Diagnosis not present

## 2021-12-08 DIAGNOSIS — Z13220 Encounter for screening for lipoid disorders: Secondary | ICD-10-CM | POA: Diagnosis not present

## 2021-12-08 DIAGNOSIS — R5383 Other fatigue: Secondary | ICD-10-CM | POA: Diagnosis not present

## 2021-12-08 DIAGNOSIS — D509 Iron deficiency anemia, unspecified: Secondary | ICD-10-CM | POA: Diagnosis not present

## 2021-12-09 DIAGNOSIS — M5032 Other cervical disc degeneration, mid-cervical region, unspecified level: Secondary | ICD-10-CM | POA: Diagnosis not present

## 2021-12-09 DIAGNOSIS — M4712 Other spondylosis with myelopathy, cervical region: Secondary | ICD-10-CM | POA: Diagnosis not present

## 2021-12-09 DIAGNOSIS — M9901 Segmental and somatic dysfunction of cervical region: Secondary | ICD-10-CM | POA: Diagnosis not present

## 2021-12-09 DIAGNOSIS — M9902 Segmental and somatic dysfunction of thoracic region: Secondary | ICD-10-CM | POA: Diagnosis not present

## 2021-12-10 ENCOUNTER — Other Ambulatory Visit: Payer: Self-pay | Admitting: Family Medicine

## 2021-12-10 DIAGNOSIS — Z1231 Encounter for screening mammogram for malignant neoplasm of breast: Secondary | ICD-10-CM

## 2021-12-23 DIAGNOSIS — M5032 Other cervical disc degeneration, mid-cervical region, unspecified level: Secondary | ICD-10-CM | POA: Diagnosis not present

## 2021-12-23 DIAGNOSIS — M4712 Other spondylosis with myelopathy, cervical region: Secondary | ICD-10-CM | POA: Diagnosis not present

## 2021-12-23 DIAGNOSIS — M9901 Segmental and somatic dysfunction of cervical region: Secondary | ICD-10-CM | POA: Diagnosis not present

## 2021-12-23 DIAGNOSIS — M9902 Segmental and somatic dysfunction of thoracic region: Secondary | ICD-10-CM | POA: Diagnosis not present

## 2021-12-28 ENCOUNTER — Ambulatory Visit
Admission: RE | Admit: 2021-12-28 | Discharge: 2021-12-28 | Disposition: A | Discharge status: Home / Self Care | Payer: BC Managed Care – PPO | Source: Ambulatory Visit | Attending: Family Medicine | Admitting: Family Medicine

## 2021-12-28 DIAGNOSIS — Z1231 Encounter for screening mammogram for malignant neoplasm of breast: Secondary | ICD-10-CM

## 2022-05-25 DIAGNOSIS — M9901 Segmental and somatic dysfunction of cervical region: Secondary | ICD-10-CM | POA: Diagnosis not present

## 2022-05-25 DIAGNOSIS — M5032 Other cervical disc degeneration, mid-cervical region, unspecified level: Secondary | ICD-10-CM | POA: Diagnosis not present

## 2022-05-25 DIAGNOSIS — M9905 Segmental and somatic dysfunction of pelvic region: Secondary | ICD-10-CM | POA: Diagnosis not present

## 2022-05-25 DIAGNOSIS — M9902 Segmental and somatic dysfunction of thoracic region: Secondary | ICD-10-CM | POA: Diagnosis not present

## 2022-05-25 DIAGNOSIS — M47812 Spondylosis without myelopathy or radiculopathy, cervical region: Secondary | ICD-10-CM | POA: Diagnosis not present

## 2022-05-25 DIAGNOSIS — M9903 Segmental and somatic dysfunction of lumbar region: Secondary | ICD-10-CM | POA: Diagnosis not present

## 2022-05-26 ENCOUNTER — Encounter: Payer: Self-pay | Admitting: Internal Medicine

## 2022-06-01 DIAGNOSIS — M9903 Segmental and somatic dysfunction of lumbar region: Secondary | ICD-10-CM | POA: Diagnosis not present

## 2022-06-01 DIAGNOSIS — M9902 Segmental and somatic dysfunction of thoracic region: Secondary | ICD-10-CM | POA: Diagnosis not present

## 2022-06-01 DIAGNOSIS — M5032 Other cervical disc degeneration, mid-cervical region, unspecified level: Secondary | ICD-10-CM | POA: Diagnosis not present

## 2022-06-01 DIAGNOSIS — M9905 Segmental and somatic dysfunction of pelvic region: Secondary | ICD-10-CM | POA: Diagnosis not present

## 2022-06-01 DIAGNOSIS — M47812 Spondylosis without myelopathy or radiculopathy, cervical region: Secondary | ICD-10-CM | POA: Diagnosis not present

## 2022-06-08 DIAGNOSIS — M47812 Spondylosis without myelopathy or radiculopathy, cervical region: Secondary | ICD-10-CM | POA: Diagnosis not present

## 2022-06-08 DIAGNOSIS — M9903 Segmental and somatic dysfunction of lumbar region: Secondary | ICD-10-CM | POA: Diagnosis not present

## 2022-06-08 DIAGNOSIS — M9905 Segmental and somatic dysfunction of pelvic region: Secondary | ICD-10-CM | POA: Diagnosis not present

## 2022-06-08 DIAGNOSIS — M9902 Segmental and somatic dysfunction of thoracic region: Secondary | ICD-10-CM | POA: Diagnosis not present

## 2022-06-08 DIAGNOSIS — M5032 Other cervical disc degeneration, mid-cervical region, unspecified level: Secondary | ICD-10-CM | POA: Diagnosis not present

## 2022-06-15 DIAGNOSIS — M47812 Spondylosis without myelopathy or radiculopathy, cervical region: Secondary | ICD-10-CM | POA: Diagnosis not present

## 2022-06-15 DIAGNOSIS — M5032 Other cervical disc degeneration, mid-cervical region, unspecified level: Secondary | ICD-10-CM | POA: Diagnosis not present

## 2022-06-15 DIAGNOSIS — M9903 Segmental and somatic dysfunction of lumbar region: Secondary | ICD-10-CM | POA: Diagnosis not present

## 2022-06-15 DIAGNOSIS — M9905 Segmental and somatic dysfunction of pelvic region: Secondary | ICD-10-CM | POA: Diagnosis not present

## 2022-06-15 DIAGNOSIS — M9902 Segmental and somatic dysfunction of thoracic region: Secondary | ICD-10-CM | POA: Diagnosis not present

## 2022-06-22 DIAGNOSIS — M5032 Other cervical disc degeneration, mid-cervical region, unspecified level: Secondary | ICD-10-CM | POA: Diagnosis not present

## 2022-06-22 DIAGNOSIS — M9902 Segmental and somatic dysfunction of thoracic region: Secondary | ICD-10-CM | POA: Diagnosis not present

## 2022-06-22 DIAGNOSIS — M9903 Segmental and somatic dysfunction of lumbar region: Secondary | ICD-10-CM | POA: Diagnosis not present

## 2022-06-22 DIAGNOSIS — M47812 Spondylosis without myelopathy or radiculopathy, cervical region: Secondary | ICD-10-CM | POA: Diagnosis not present

## 2022-06-22 DIAGNOSIS — M9905 Segmental and somatic dysfunction of pelvic region: Secondary | ICD-10-CM | POA: Diagnosis not present

## 2022-06-22 DIAGNOSIS — M9901 Segmental and somatic dysfunction of cervical region: Secondary | ICD-10-CM | POA: Diagnosis not present

## 2022-06-29 ENCOUNTER — Encounter: Payer: Self-pay | Admitting: Internal Medicine

## 2022-06-29 DIAGNOSIS — M9902 Segmental and somatic dysfunction of thoracic region: Secondary | ICD-10-CM | POA: Diagnosis not present

## 2022-06-29 DIAGNOSIS — M5032 Other cervical disc degeneration, mid-cervical region, unspecified level: Secondary | ICD-10-CM | POA: Diagnosis not present

## 2022-06-29 DIAGNOSIS — M47812 Spondylosis without myelopathy or radiculopathy, cervical region: Secondary | ICD-10-CM | POA: Diagnosis not present

## 2022-06-29 DIAGNOSIS — M9903 Segmental and somatic dysfunction of lumbar region: Secondary | ICD-10-CM | POA: Diagnosis not present

## 2022-06-29 DIAGNOSIS — M9905 Segmental and somatic dysfunction of pelvic region: Secondary | ICD-10-CM | POA: Diagnosis not present

## 2022-07-06 DIAGNOSIS — M5032 Other cervical disc degeneration, mid-cervical region, unspecified level: Secondary | ICD-10-CM | POA: Diagnosis not present

## 2022-07-06 DIAGNOSIS — M9902 Segmental and somatic dysfunction of thoracic region: Secondary | ICD-10-CM | POA: Diagnosis not present

## 2022-07-06 DIAGNOSIS — M9905 Segmental and somatic dysfunction of pelvic region: Secondary | ICD-10-CM | POA: Diagnosis not present

## 2022-07-06 DIAGNOSIS — M9903 Segmental and somatic dysfunction of lumbar region: Secondary | ICD-10-CM | POA: Diagnosis not present

## 2022-07-06 DIAGNOSIS — M47812 Spondylosis without myelopathy or radiculopathy, cervical region: Secondary | ICD-10-CM | POA: Diagnosis not present

## 2022-07-13 DIAGNOSIS — M9905 Segmental and somatic dysfunction of pelvic region: Secondary | ICD-10-CM | POA: Diagnosis not present

## 2022-07-13 DIAGNOSIS — M5032 Other cervical disc degeneration, mid-cervical region, unspecified level: Secondary | ICD-10-CM | POA: Diagnosis not present

## 2022-07-13 DIAGNOSIS — M9903 Segmental and somatic dysfunction of lumbar region: Secondary | ICD-10-CM | POA: Diagnosis not present

## 2022-07-13 DIAGNOSIS — M47812 Spondylosis without myelopathy or radiculopathy, cervical region: Secondary | ICD-10-CM | POA: Diagnosis not present

## 2022-07-13 DIAGNOSIS — M9902 Segmental and somatic dysfunction of thoracic region: Secondary | ICD-10-CM | POA: Diagnosis not present

## 2022-07-20 DIAGNOSIS — M5032 Other cervical disc degeneration, mid-cervical region, unspecified level: Secondary | ICD-10-CM | POA: Diagnosis not present

## 2022-07-20 DIAGNOSIS — M9905 Segmental and somatic dysfunction of pelvic region: Secondary | ICD-10-CM | POA: Diagnosis not present

## 2022-07-20 DIAGNOSIS — M9901 Segmental and somatic dysfunction of cervical region: Secondary | ICD-10-CM | POA: Diagnosis not present

## 2022-07-20 DIAGNOSIS — M9903 Segmental and somatic dysfunction of lumbar region: Secondary | ICD-10-CM | POA: Diagnosis not present

## 2022-07-20 DIAGNOSIS — M47812 Spondylosis without myelopathy or radiculopathy, cervical region: Secondary | ICD-10-CM | POA: Diagnosis not present

## 2022-08-03 DIAGNOSIS — M9905 Segmental and somatic dysfunction of pelvic region: Secondary | ICD-10-CM | POA: Diagnosis not present

## 2022-08-03 DIAGNOSIS — M5032 Other cervical disc degeneration, mid-cervical region, unspecified level: Secondary | ICD-10-CM | POA: Diagnosis not present

## 2022-08-03 DIAGNOSIS — M47812 Spondylosis without myelopathy or radiculopathy, cervical region: Secondary | ICD-10-CM | POA: Diagnosis not present

## 2022-08-03 DIAGNOSIS — M9903 Segmental and somatic dysfunction of lumbar region: Secondary | ICD-10-CM | POA: Diagnosis not present

## 2022-08-17 DIAGNOSIS — M9903 Segmental and somatic dysfunction of lumbar region: Secondary | ICD-10-CM | POA: Diagnosis not present

## 2022-08-17 DIAGNOSIS — M47812 Spondylosis without myelopathy or radiculopathy, cervical region: Secondary | ICD-10-CM | POA: Diagnosis not present

## 2022-08-17 DIAGNOSIS — M9905 Segmental and somatic dysfunction of pelvic region: Secondary | ICD-10-CM | POA: Diagnosis not present

## 2022-08-17 DIAGNOSIS — M5032 Other cervical disc degeneration, mid-cervical region, unspecified level: Secondary | ICD-10-CM | POA: Diagnosis not present

## 2022-09-07 DIAGNOSIS — M9903 Segmental and somatic dysfunction of lumbar region: Secondary | ICD-10-CM | POA: Diagnosis not present

## 2022-09-07 DIAGNOSIS — M47812 Spondylosis without myelopathy or radiculopathy, cervical region: Secondary | ICD-10-CM | POA: Diagnosis not present

## 2022-09-07 DIAGNOSIS — M5032 Other cervical disc degeneration, mid-cervical region, unspecified level: Secondary | ICD-10-CM | POA: Diagnosis not present

## 2022-09-07 DIAGNOSIS — M9905 Segmental and somatic dysfunction of pelvic region: Secondary | ICD-10-CM | POA: Diagnosis not present

## 2022-09-21 DIAGNOSIS — M47812 Spondylosis without myelopathy or radiculopathy, cervical region: Secondary | ICD-10-CM | POA: Diagnosis not present

## 2022-09-21 DIAGNOSIS — M9903 Segmental and somatic dysfunction of lumbar region: Secondary | ICD-10-CM | POA: Diagnosis not present

## 2022-09-21 DIAGNOSIS — M5032 Other cervical disc degeneration, mid-cervical region, unspecified level: Secondary | ICD-10-CM | POA: Diagnosis not present

## 2022-09-21 DIAGNOSIS — M9905 Segmental and somatic dysfunction of pelvic region: Secondary | ICD-10-CM | POA: Diagnosis not present

## 2022-10-05 DIAGNOSIS — M5032 Other cervical disc degeneration, mid-cervical region, unspecified level: Secondary | ICD-10-CM | POA: Diagnosis not present

## 2022-10-05 DIAGNOSIS — M9903 Segmental and somatic dysfunction of lumbar region: Secondary | ICD-10-CM | POA: Diagnosis not present

## 2022-10-05 DIAGNOSIS — M9905 Segmental and somatic dysfunction of pelvic region: Secondary | ICD-10-CM | POA: Diagnosis not present

## 2022-10-05 DIAGNOSIS — M47812 Spondylosis without myelopathy or radiculopathy, cervical region: Secondary | ICD-10-CM | POA: Diagnosis not present

## 2022-10-19 DIAGNOSIS — M47812 Spondylosis without myelopathy or radiculopathy, cervical region: Secondary | ICD-10-CM | POA: Diagnosis not present

## 2022-10-19 DIAGNOSIS — M5032 Other cervical disc degeneration, mid-cervical region, unspecified level: Secondary | ICD-10-CM | POA: Diagnosis not present

## 2022-10-19 DIAGNOSIS — M9905 Segmental and somatic dysfunction of pelvic region: Secondary | ICD-10-CM | POA: Diagnosis not present

## 2022-10-19 DIAGNOSIS — M9903 Segmental and somatic dysfunction of lumbar region: Secondary | ICD-10-CM | POA: Diagnosis not present

## 2022-11-02 DIAGNOSIS — M5032 Other cervical disc degeneration, mid-cervical region, unspecified level: Secondary | ICD-10-CM | POA: Diagnosis not present

## 2022-11-02 DIAGNOSIS — M47812 Spondylosis without myelopathy or radiculopathy, cervical region: Secondary | ICD-10-CM | POA: Diagnosis not present

## 2022-11-02 DIAGNOSIS — M9903 Segmental and somatic dysfunction of lumbar region: Secondary | ICD-10-CM | POA: Diagnosis not present

## 2022-11-02 DIAGNOSIS — M9905 Segmental and somatic dysfunction of pelvic region: Secondary | ICD-10-CM | POA: Diagnosis not present

## 2022-11-16 DIAGNOSIS — M9905 Segmental and somatic dysfunction of pelvic region: Secondary | ICD-10-CM | POA: Diagnosis not present

## 2022-11-16 DIAGNOSIS — M5032 Other cervical disc degeneration, mid-cervical region, unspecified level: Secondary | ICD-10-CM | POA: Diagnosis not present

## 2022-11-16 DIAGNOSIS — M47812 Spondylosis without myelopathy or radiculopathy, cervical region: Secondary | ICD-10-CM | POA: Diagnosis not present

## 2022-11-16 DIAGNOSIS — M9903 Segmental and somatic dysfunction of lumbar region: Secondary | ICD-10-CM | POA: Diagnosis not present

## 2022-11-23 ENCOUNTER — Ambulatory Visit: Payer: BC Managed Care – PPO | Admitting: Medical

## 2022-11-23 ENCOUNTER — Encounter: Payer: Self-pay | Admitting: Medical

## 2022-11-23 VITALS — BP 120/80 | HR 79 | Temp 97.9°F | Wt 130.0 lb

## 2022-11-23 DIAGNOSIS — I889 Nonspecific lymphadenitis, unspecified: Secondary | ICD-10-CM | POA: Diagnosis not present

## 2022-11-23 DIAGNOSIS — R21 Rash and other nonspecific skin eruption: Secondary | ICD-10-CM | POA: Diagnosis not present

## 2022-11-23 DIAGNOSIS — L981 Factitial dermatitis: Secondary | ICD-10-CM | POA: Diagnosis not present

## 2022-11-23 MED ORDER — VALACYCLOVIR HCL 1 G PO TABS: 1000.0000 mg | ORAL_TABLET | Freq: Three times a day (TID) | ORAL | 0 refills | Status: AC

## 2022-11-23 MED ORDER — DOXYCYCLINE HYCLATE 100 MG PO TABS: 100.0000 mg | ORAL_TABLET | Freq: Two times a day (BID) | ORAL | 0 refills | Status: AC

## 2022-11-23 NOTE — Patient Instructions (Signed)
Recommendations: Begin Valtrex antiviral medication 3 times a day for up to a week Begin doxycycline antibiotic twice a day for 1 week If the rash completely clears up within the next 3 to 4 days then you can stop the Valtrex and the doxycycline by day 5.  Otherwise complete the full course of Valtrex and doxycycline Keep the area clean with soap and water on the back of the neck and upper back Cover those 2 lesions with a Band-Aid until they are cleared up If you get similar rash or bump on the upper back and neck again come in soon after they start to let me look at them

## 2022-11-23 NOTE — Progress Notes (Signed)
Subjective:  Robin Escobar is a 47 y.o. female who presents for Chief Complaint  Patient presents with   lumps    Lumps on side of neck for the last 3 days. Moveable and some painful      Here for some lymph nodes swollen bilat neck.   Has a spot on back.  The 2 areas of rash on her upper back started out like whiteheads or red bumps.  They are currently scabby.  Not sure she was bit by an insect or other infection.  No fever, no appetite changes.   Has lost some weight in recent month from activity and starting a new job.  Working at [employer], very labor intensive.   No bleeding or bruising.  No history of shingles or herpetic lesion.  No other aggravating or relieving factors.    No other c/o.  Past Medical History:  Diagnosis Date   Abnormal Pap smear of cervix    LGSIL 3/08   Dysthymia    Former smoker    GAD (generalized anxiety disorder)    No current outpatient medications on file prior to visit.   No current facility-administered medications on file prior to visit.    The following portions of the patient's history were reviewed and updated as appropriate: allergies, current medications, past family history, past medical history, past social history, past surgical history and problem list.  ROS Otherwise as in subjective above  Objective: BP 120/80   Pulse 79   Temp 97.9 F (36.6 C)   Wt 130 lb (59 kg)   BMI 23.03 kg/m   General appearance: alert, no distress, well developed, well nourished HEENT: normocephalic, sclerae anicteric, conjunctiva pink and moist Oral cavity: MMM, no lesions Neck: supple, shotty tender posterior cervical lymph nodes inflamed bilaterally, but non enlarged, otherwise no other swollen or abnormal lymph nodes no thyromegaly, no masses Midline upper back over C7 and a little lower around T2 with 2 separate somewhat raised somewhat swollen erythematous lesions.  The inferior 1 has some purplish scab, the superior lesion has a couple  ulceration suggestive of possible vesicular lesion but it is also trying to scab over.  No liquid drainage, no vesicles currently in place   Assessment: Encounter Diagnoses  Name Primary?   Rash Yes   Lymphadenitis      Plan: We discussed that the 2 skin lesion rash areas are trying to heal up and is not clear exactly what this represented.  We discussed the possibility of boil, insect bite, herpetic lesion or other.  Labs as below.  Begin the following treatment recommendations.  Avoid contact with the people with this rash as it could be contagious.  Patient Instructions  Recommendations: Begin Valtrex antiviral medication 3 times a day for up to a week Begin doxycycline antibiotic twice a day for 1 week If the rash completely clears up within the next 3 to 4 days then you can stop the Valtrex and the doxycycline by day 5.  Otherwise complete the full course of Valtrex and doxycycline Keep the area clean with soap and water on the back of the neck and upper back Cover those 2 lesions with a Band-Aid until they are cleared up If you get similar rash or bump on the upper back and neck again come in soon after they start to let me look at them    Mike was seen today for lumps.  Diagnoses and all orders for this visit:  Rash -  CBC with Differential/Platelet -     HSV 1 and 2 Ab, IgG  Lymphadenitis -     CBC with Differential/Platelet -     HSV 1 and 2 Ab, IgG  Other orders -     valACYclovir (VALTREX) 1000 MG tablet; Take 1 tablet (1,000 mg total) by mouth 3 (three) times daily for 7 days. -     doxycycline (VIBRA-TABS) 100 MG tablet; Take 1 tablet (100 mg total) by mouth 2 (two) times daily.    Follow up: Pending labs

## 2022-11-24 LAB — HSV 1 AND 2 AB, IGG
HSV 1 Glycoprotein G Ab, IgG: 32.6 index — ABNORMAL HIGH (ref 0.00–0.90)
HSV 2 IgG, Type Spec: <0.91 index (ref 0.00–0.90)

## 2022-11-24 LAB — CBC WITH DIFFERENTIAL/PLATELET
Basophils Absolute: 0.1 10*3/uL (ref 0.0–0.2)
Basos: 1 %
EOS (ABSOLUTE): 0.1 10*3/uL (ref 0.0–0.4)
Eos: 3 %
Hematocrit: 37.9 % (ref 34.0–46.6)
Hemoglobin: 12.7 g/dL (ref 11.1–15.9)
Immature Grans (Abs): 0 10*3/uL (ref 0.0–0.1)
Immature Granulocytes: 0 %
Lymphocytes Absolute: 1.8 10*3/uL (ref 0.7–3.1)
Lymphs: 38 %
MCH: 30 pg (ref 26.6–33.0)
MCHC: 33.5 g/dL (ref 31.5–35.7)
MCV: 89 fL (ref 79–97)
Monocytes Absolute: 0.7 10*3/uL (ref 0.1–0.9)
Monocytes: 14 %
Neutrophils Absolute: 2.2 10*3/uL (ref 1.4–7.0)
Neutrophils: 44 %
Platelets: 261 10*3/uL (ref 150–450)
RBC: 4.24 x10E6/uL (ref 3.77–5.28)
RDW: 11.9 % (ref 11.7–15.4)
WBC: 4.8 10*3/uL (ref 3.4–10.8)

## 2022-11-24 NOTE — Progress Notes (Signed)
Results sent through MyChart

## 2022-11-30 DIAGNOSIS — M47812 Spondylosis without myelopathy or radiculopathy, cervical region: Secondary | ICD-10-CM | POA: Diagnosis not present

## 2022-11-30 DIAGNOSIS — M9905 Segmental and somatic dysfunction of pelvic region: Secondary | ICD-10-CM | POA: Diagnosis not present

## 2022-11-30 DIAGNOSIS — M9903 Segmental and somatic dysfunction of lumbar region: Secondary | ICD-10-CM | POA: Diagnosis not present

## 2022-11-30 DIAGNOSIS — M5032 Other cervical disc degeneration, mid-cervical region, unspecified level: Secondary | ICD-10-CM | POA: Diagnosis not present

## 2022-12-21 DIAGNOSIS — M5032 Other cervical disc degeneration, mid-cervical region, unspecified level: Secondary | ICD-10-CM | POA: Diagnosis not present

## 2022-12-21 DIAGNOSIS — M47812 Spondylosis without myelopathy or radiculopathy, cervical region: Secondary | ICD-10-CM | POA: Diagnosis not present

## 2022-12-21 DIAGNOSIS — M9905 Segmental and somatic dysfunction of pelvic region: Secondary | ICD-10-CM | POA: Diagnosis not present

## 2022-12-21 DIAGNOSIS — M9903 Segmental and somatic dysfunction of lumbar region: Secondary | ICD-10-CM | POA: Diagnosis not present

## 2023-01-11 DIAGNOSIS — M5032 Other cervical disc degeneration, mid-cervical region, unspecified level: Secondary | ICD-10-CM | POA: Diagnosis not present

## 2023-01-11 DIAGNOSIS — M47812 Spondylosis without myelopathy or radiculopathy, cervical region: Secondary | ICD-10-CM | POA: Diagnosis not present

## 2023-01-11 DIAGNOSIS — M9905 Segmental and somatic dysfunction of pelvic region: Secondary | ICD-10-CM | POA: Diagnosis not present

## 2023-01-11 DIAGNOSIS — M9903 Segmental and somatic dysfunction of lumbar region: Secondary | ICD-10-CM | POA: Diagnosis not present

## 2023-02-01 DIAGNOSIS — M9903 Segmental and somatic dysfunction of lumbar region: Secondary | ICD-10-CM | POA: Diagnosis not present

## 2023-02-01 DIAGNOSIS — M5032 Other cervical disc degeneration, mid-cervical region, unspecified level: Secondary | ICD-10-CM | POA: Diagnosis not present

## 2023-02-01 DIAGNOSIS — M47812 Spondylosis without myelopathy or radiculopathy, cervical region: Secondary | ICD-10-CM | POA: Diagnosis not present

## 2023-02-01 DIAGNOSIS — M9905 Segmental and somatic dysfunction of pelvic region: Secondary | ICD-10-CM | POA: Diagnosis not present

## 2023-02-15 ENCOUNTER — Encounter: Payer: Self-pay | Admitting: Medical

## 2023-02-15 DIAGNOSIS — L989 Disorder of the skin and subcutaneous tissue, unspecified: Secondary | ICD-10-CM | POA: Insufficient documentation

## 2023-02-22 DIAGNOSIS — M47812 Spondylosis without myelopathy or radiculopathy, cervical region: Secondary | ICD-10-CM | POA: Diagnosis not present

## 2023-02-22 DIAGNOSIS — M9903 Segmental and somatic dysfunction of lumbar region: Secondary | ICD-10-CM | POA: Diagnosis not present

## 2023-02-22 DIAGNOSIS — M9905 Segmental and somatic dysfunction of pelvic region: Secondary | ICD-10-CM | POA: Diagnosis not present

## 2023-02-22 DIAGNOSIS — M5032 Other cervical disc degeneration, mid-cervical region, unspecified level: Secondary | ICD-10-CM | POA: Diagnosis not present

## 2023-03-22 DIAGNOSIS — M9903 Segmental and somatic dysfunction of lumbar region: Secondary | ICD-10-CM | POA: Diagnosis not present

## 2023-03-22 DIAGNOSIS — M5032 Other cervical disc degeneration, mid-cervical region, unspecified level: Secondary | ICD-10-CM | POA: Diagnosis not present

## 2023-03-22 DIAGNOSIS — M47812 Spondylosis without myelopathy or radiculopathy, cervical region: Secondary | ICD-10-CM | POA: Diagnosis not present

## 2023-03-22 DIAGNOSIS — M9905 Segmental and somatic dysfunction of pelvic region: Secondary | ICD-10-CM | POA: Diagnosis not present

## 2023-04-19 DIAGNOSIS — M9905 Segmental and somatic dysfunction of pelvic region: Secondary | ICD-10-CM | POA: Diagnosis not present

## 2023-04-19 DIAGNOSIS — M9903 Segmental and somatic dysfunction of lumbar region: Secondary | ICD-10-CM | POA: Diagnosis not present

## 2023-04-19 DIAGNOSIS — M47812 Spondylosis without myelopathy or radiculopathy, cervical region: Secondary | ICD-10-CM | POA: Diagnosis not present

## 2023-04-19 DIAGNOSIS — M5032 Other cervical disc degeneration, mid-cervical region, unspecified level: Secondary | ICD-10-CM | POA: Diagnosis not present

## 2023-06-21 DIAGNOSIS — M9905 Segmental and somatic dysfunction of pelvic region: Secondary | ICD-10-CM | POA: Diagnosis not present

## 2023-06-21 DIAGNOSIS — M9903 Segmental and somatic dysfunction of lumbar region: Secondary | ICD-10-CM | POA: Diagnosis not present

## 2023-06-21 DIAGNOSIS — M47812 Spondylosis without myelopathy or radiculopathy, cervical region: Secondary | ICD-10-CM | POA: Diagnosis not present

## 2023-06-21 DIAGNOSIS — M5032 Other cervical disc degeneration, mid-cervical region, unspecified level: Secondary | ICD-10-CM | POA: Diagnosis not present

## 2024-03-05 IMAGING — MG MM DIGITAL SCREENING BILAT W/ TOMO AND CAD
8 series · 8 of 24 positions shown · non-contrast
Comparison: Previous exam(s).

CLINICAL DATA: Screening.

EXAM:
DIGITAL SCREENING BILATERAL MAMMOGRAM WITH TOMOSYNTHESIS AND CAD
TECHNIQUE: Bilateral screening digital craniocaudal and mediolateral oblique
mammograms were obtained. Bilateral screening digital breast
tomosynthesis was performed. The images were evaluated with
computer-aided detection.

[L CC synth-2D]
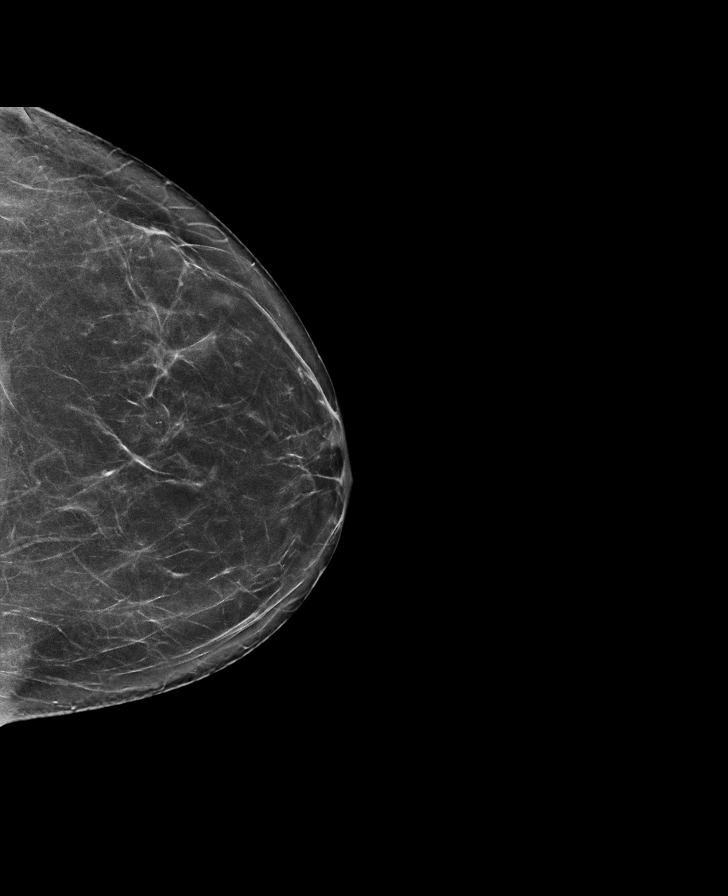

[L MLO synth-2D]
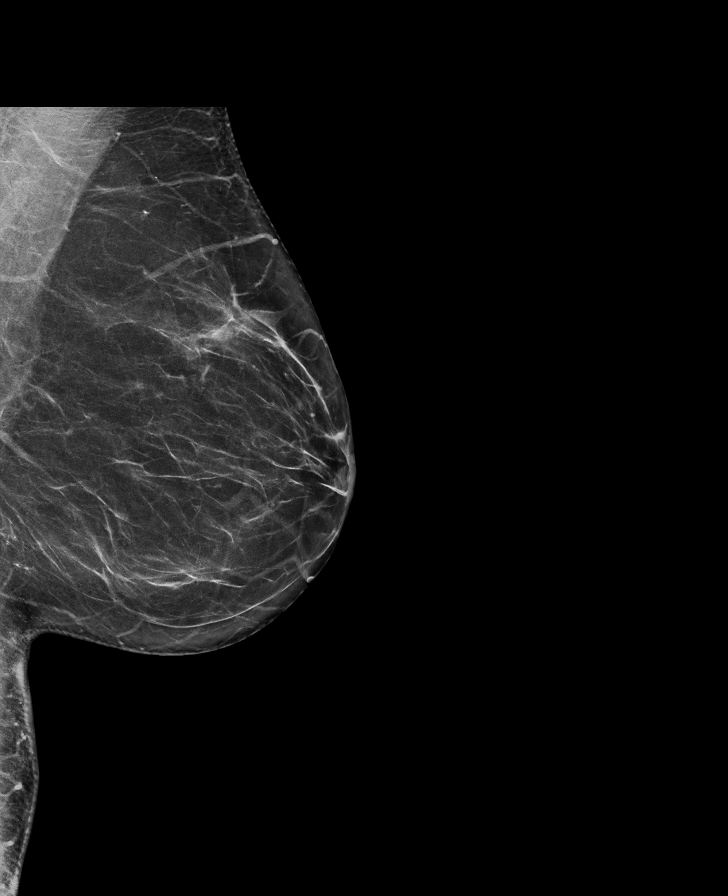

[R CC synth-2D]
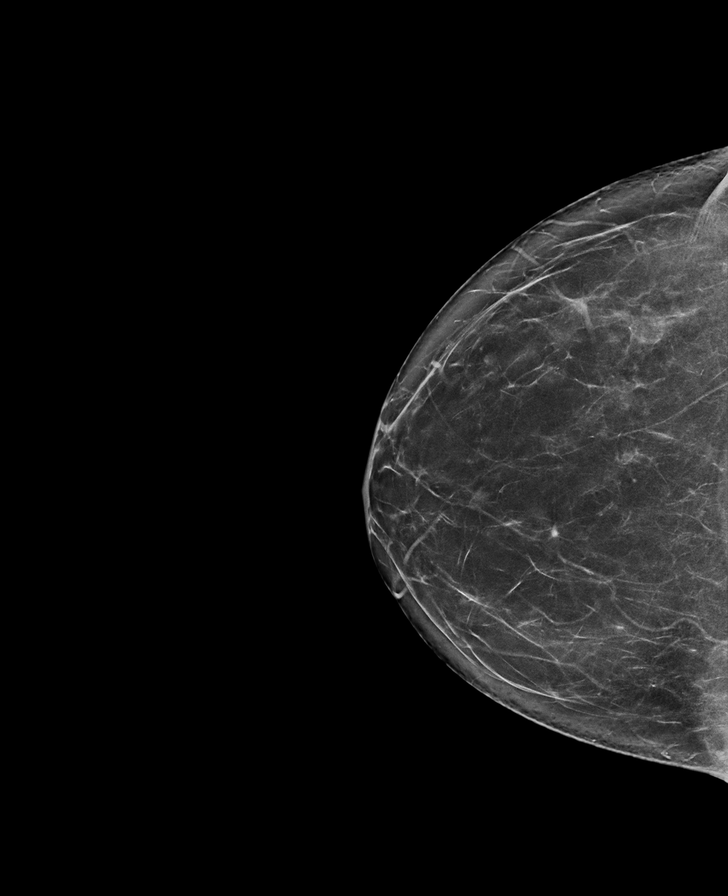

[R MLO synth-2D]
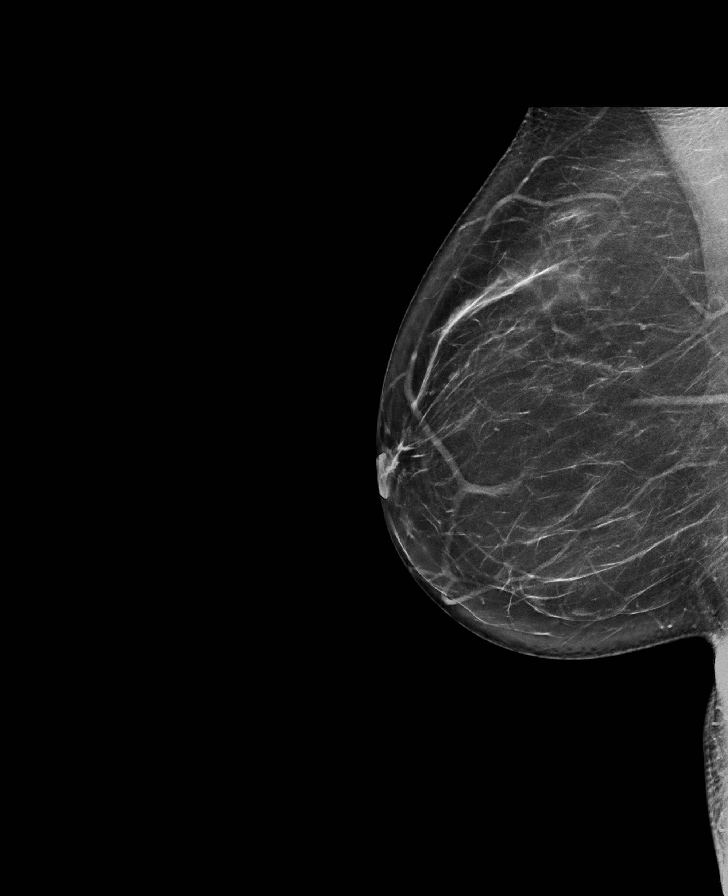

[R MLO tomo · tomo slice 39/77.0]
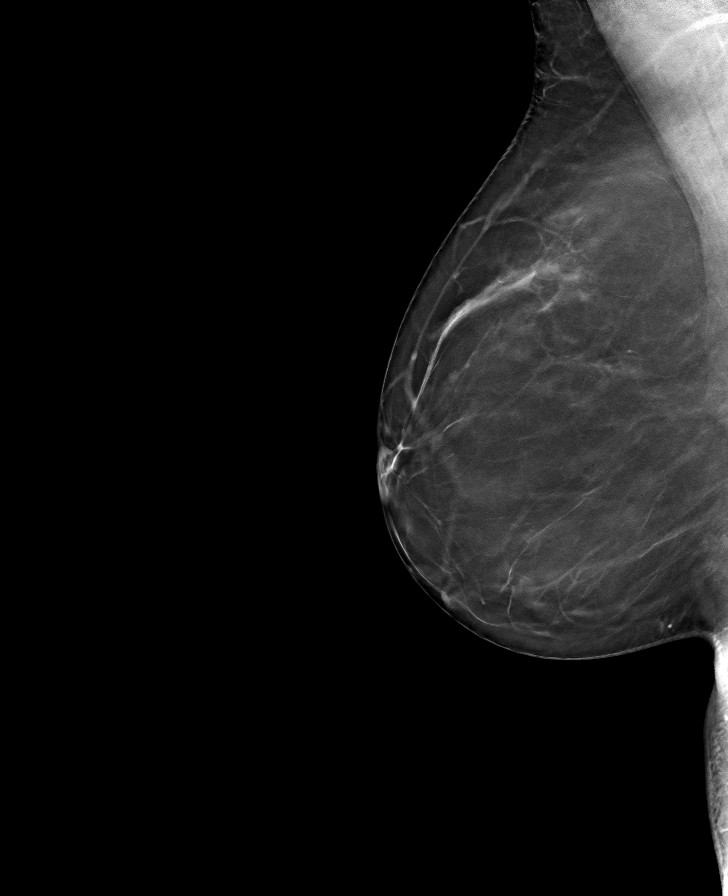

[R CC tomo · tomo slice 37/74.0]
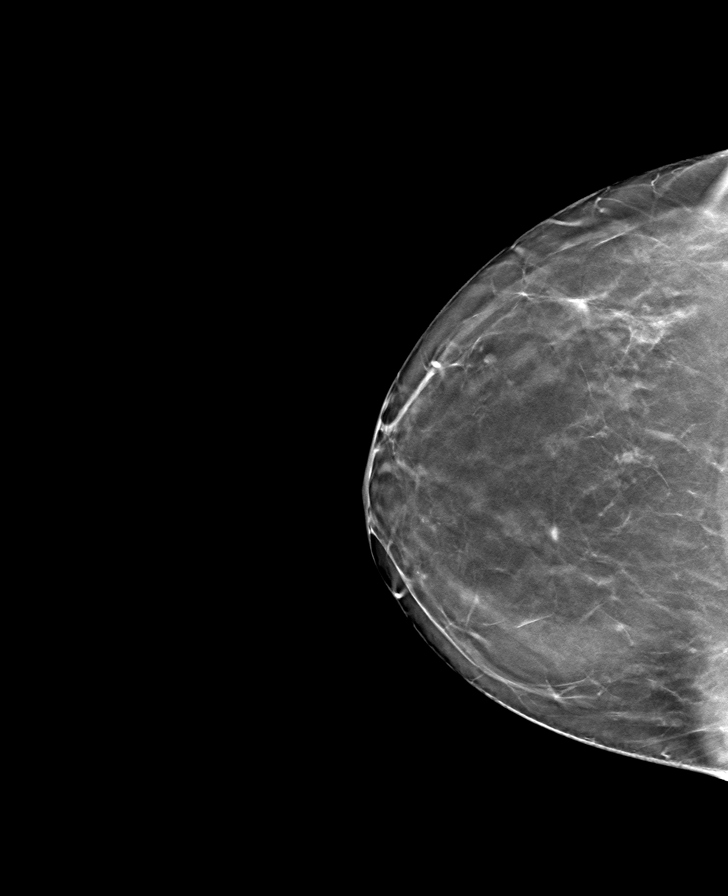

[L CC tomo · tomo slice 37/72.0]
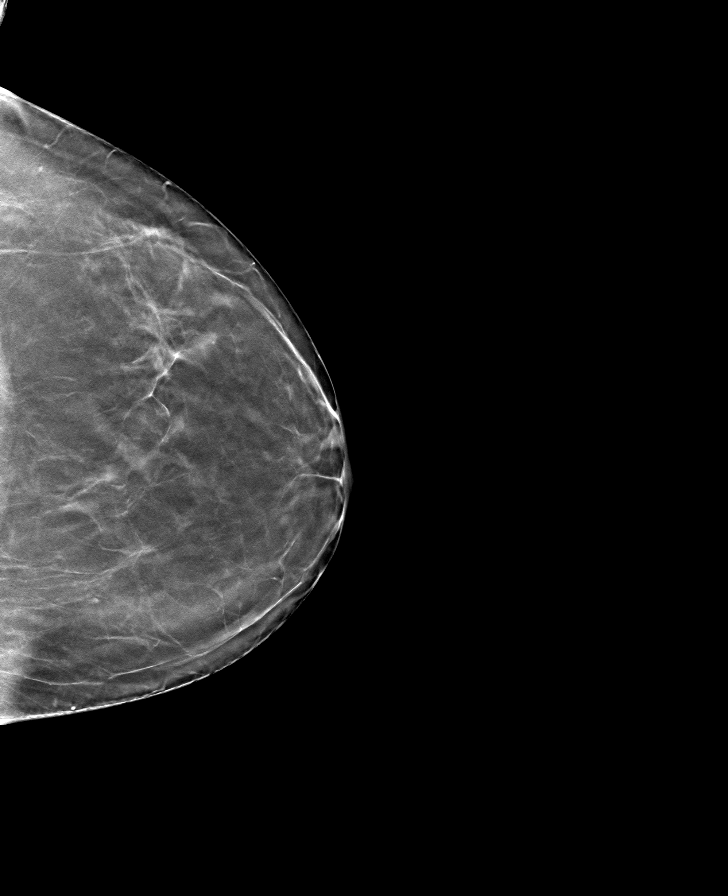

[L MLO tomo · tomo slice 37/73.0]
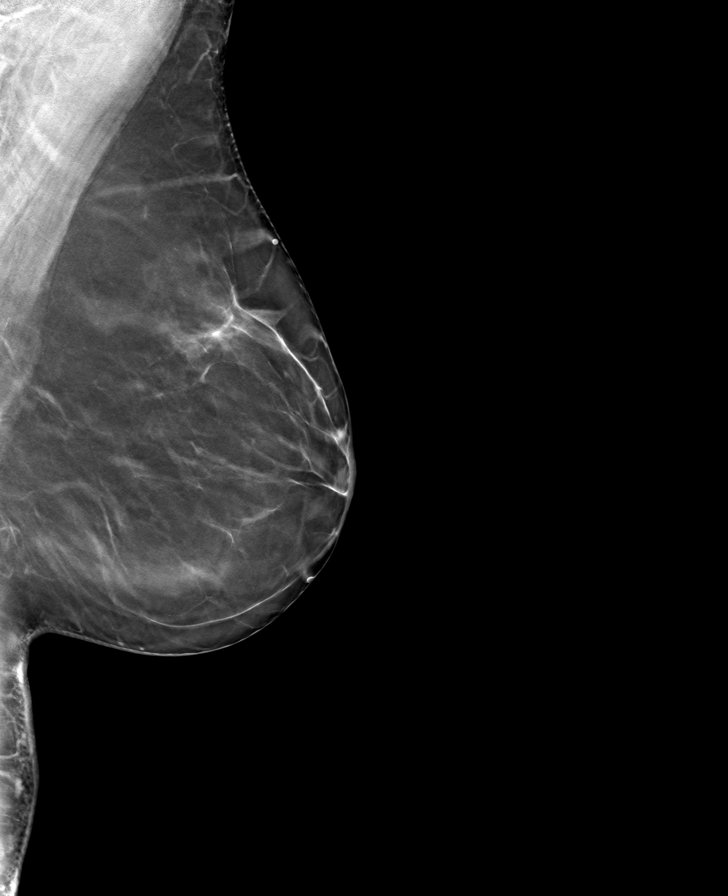

[8 of 24 positions shown; findings below may reference images not displayed]

ACR Breast Density Category b: There are scattered areas of
fibroglandular density.
FINDINGS: There are no findings suspicious for malignancy.
IMPRESSION: No mammographic evidence of malignancy. A result letter of this
screening mammogram will be mailed directly to the patient.

RECOMMENDATION:
Screening mammogram in one year. (Code:51-O-LD2)

BI-RADS CATEGORY  1: Negative.
